# Patient Record
Sex: Female | Born: 2000 | Race: White | Hispanic: Yes | Marital: Single | Smoking: Never smoker
Health system: Southern US, Community
[De-identification: ages and names within clinical notes are randomized; demographics above are authoritative.]

## PROBLEM LIST (undated history)

## (undated) DIAGNOSIS — Z789 Other specified health status: Secondary | ICD-10-CM

## (undated) DIAGNOSIS — E282 Polycystic ovarian syndrome: Secondary | ICD-10-CM

## (undated) DIAGNOSIS — E559 Vitamin D deficiency, unspecified: Secondary | ICD-10-CM

## (undated) HISTORY — DX: Other specified health status: Z78.9

## (undated) HISTORY — DX: Polycystic ovarian syndrome: E28.2

## (undated) HISTORY — DX: Vitamin D deficiency, unspecified: E55.9

---
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
## 2020-03-17 ENCOUNTER — Encounter: Payer: Self-pay | Admitting: Pediatrics

## 2020-09-26 ENCOUNTER — Ambulatory Visit (INDEPENDENT_AMBULATORY_CARE_PROVIDER_SITE_OTHER): Payer: Medicaid Other | Admitting: Pediatrics

## 2020-09-26 ENCOUNTER — Other Ambulatory Visit: Payer: Self-pay

## 2020-09-26 ENCOUNTER — Encounter: Payer: Self-pay | Admitting: Pediatrics

## 2020-09-26 ENCOUNTER — Other Ambulatory Visit (HOSPITAL_COMMUNITY)
Admission: RE | Admit: 2020-09-26 | Discharge: 2020-09-26 | Disposition: A | Discharge status: Home / Self Care | Payer: Medicaid Other | Source: Ambulatory Visit | Attending: Pediatrics | Admitting: Pediatrics

## 2020-09-26 VITALS — BP 110/76 | Ht 63.25 in | Wt 200.4 lb

## 2020-09-26 DIAGNOSIS — E282 Polycystic ovarian syndrome: Secondary | ICD-10-CM | POA: Diagnosis not present

## 2020-09-26 DIAGNOSIS — Z114 Encounter for screening for human immunodeficiency virus [HIV]: Secondary | ICD-10-CM | POA: Diagnosis not present

## 2020-09-26 DIAGNOSIS — Z23 Encounter for immunization: Secondary | ICD-10-CM

## 2020-09-26 DIAGNOSIS — Z3202 Encounter for pregnancy test, result negative: Secondary | ICD-10-CM

## 2020-09-26 DIAGNOSIS — G4700 Insomnia, unspecified: Secondary | ICD-10-CM | POA: Diagnosis not present

## 2020-09-26 DIAGNOSIS — Z113 Encounter for screening for infections with a predominantly sexual mode of transmission: Secondary | ICD-10-CM | POA: Insufficient documentation

## 2020-09-26 DIAGNOSIS — R5383 Other fatigue: Secondary | ICD-10-CM

## 2020-09-26 DIAGNOSIS — Z68.41 Body mass index (BMI) pediatric, greater than or equal to 95th percentile for age: Secondary | ICD-10-CM | POA: Diagnosis not present

## 2020-09-26 DIAGNOSIS — Z0101 Encounter for examination of eyes and vision with abnormal findings: Secondary | ICD-10-CM | POA: Diagnosis not present

## 2020-09-26 DIAGNOSIS — L83 Acanthosis nigricans: Secondary | ICD-10-CM | POA: Diagnosis not present

## 2020-09-26 DIAGNOSIS — Z0001 Encounter for general adult medical examination with abnormal findings: Secondary | ICD-10-CM | POA: Diagnosis not present

## 2020-09-26 LAB — POCT HEMOGLOBIN: Hemoglobin: 14.8 g/dL — AB (ref 11–14.6)

## 2020-09-26 LAB — POCT RAPID HIV: Rapid HIV, POC: NEGATIVE

## 2020-09-26 LAB — POCT URINE PREGNANCY: Preg Test, Ur: NEGATIVE

## 2020-09-26 MED ORDER — NORETHINDRONE ACET-ETHINYL EST 1.5-30 MG-MCG PO TABS: 1.0000 | ORAL_TABLET | Freq: Every day | ORAL | 1 refills | Status: DC

## 2020-09-26 NOTE — Progress Notes (Signed)
Adolescent Well Care Visit Molly Walker is a 20 y.o. female who is here for well care. In 2019, dropped out of senior year, had just a few months left. Moved to [place] with a significant other and worked as a Conservation officer, nature at [employer]. Did not have any regular medical care in [place] and here for a check up.    PCP:  Molly Neat, MD   History was provided by the patient. Accompanied by older sister and nephew.   Confidentiality was discussed with the patient and, if applicable, with caregiver as well. Patient's personal or confidential phone number: [phone]  Current Issues: Current concerns include:  Fatigue   - Started about 1 month ago when she moved back to [facility] for [place], moved in with dad - Tried when she wakes up in the morning  - Trouble falling asleep , overthinks, on phone   Low Vit D - Recalls taking Vit D supplementation for 30 days in 2019, no Vit D supplements since  H/o PCOS - Menses q1-6 months, lasts 4 days, 1st day is heavy, following days lighter  - Has some facial hair and acne  - Stopped OCP in Nov 2019 - Moved to [place], not receiving medical care there   COVID Vaccine  - Dose #1 over the summer   Nutrition: Nutrition/Eating Behaviors: good variety, fruits and veg daily  Adequate calcium in diet?: cheese, yogurt Supplements/ Vitamins: no  Exercise/ Media: Play any Sports?:  none Exercise:  not active Screen Time:  > 2 hours-counseling provided Media Rules or Monitoring?: adult  Sleep:  Sleep: trouble falling asleep sometimes, not every night, uses phone at night, "overthinks"  Social Screening: Lives with:  Dad, brother 69 yo Parental relations:  good Activities, Work, and Regulatory affairs officer?: no working, helps dad around house Concerns regarding behavior with peers?  n/a Stressors of note: none  Education: School Name: dropped out  School Grade: n/a School performance: n/a School Behavior: n/a  Menstruation:   Menstrual History: 3/9  Patient has  a dental home: no  Confidential social history: Tobacco?  no Secondhand smoke exposure?  no Drugs/ETOH?  yes, Edibles  Sexually Active?  no   Pregnancy Prevention: no  Safe at home, in school & in relationships?  Yes Safe to self?  Yes   Screenings:  The patient completed the Rapid Assessment for Adolescent Preventive Services screening questionnaire and the following topics were identified as risk factors and discussed: exercise and sexuality  In addition, the following topics were discussed as part of anticipatory guidance healthy eating, exercise, tobacco use, marijuana use, drug use, condom use, birth control, sexuality, mental health issues and screen time.  PHQ-9 completed and results indicated 4  Physical Exam:  Vitals:   09/26/20 1420  BP: 110/76  Weight: 200 lb 6.4 oz (90.9 kg)  Height: 5' 3.25" (1.607 m)   BP 110/76   Ht 5' 3.25" (1.607 m)   Wt 200 lb 6.4 oz (90.9 kg)   BMI 35.22 kg/m  Body mass index: body mass index is 35.22 kg/m. Blood pressure percentiles are not available for patients who are 18 years or older.   Hearing Screening   Method: Audiometry   125Hz  250Hz  500Hz  1000Hz  2000Hz  3000Hz  4000Hz  6000Hz  8000Hz   Right ear:   20 20 20  20     Left ear:   20 20 20  20       Visual Acuity Screening   Right eye Left eye Both eyes  Without correction: 20/50 20/25  With correction:       Physical Exam General: well-appearing 20 yo F  Head: normocephalic Eyes: sclera clear, PERRL Nose: nares patent, no congestion  Mouth: moist mucous membranes, post OP clear Neck: supple, mild acanthosis nigricans  Resp: normal work, clear to auscultation BL CV: regular rate, normal S1/2, no murmur, 2+ distal pulses Ab: soft, non-distended, non-tender, + bowel sounds, no masses palpated  Skin: no rash   Neuro: awake, alert, answers questions appropriately    Assessment and Plan:    Molly Walker is a 20 yo F with the following problems here for preventative care  as a new patient  1. Encounter for general adult medical examination with abnormal findings - Hearing screening result: normal - Vision screening result: abnormal - POCT urine pregnancy negative  - Dicussed transition to adult care soon - Dicussed GED, she is thinking about applying for Fall 2022  2. BMI (body mass index), pediatric, 95-99% for age - BMI is not appropriate for age - Strong Family history of DM - Counseled on nutrition and physical activity  - POCT hemoglobin - ALT - AST - Hemoglobin A1c - Lipid panel - VITAMIN D 25 Hydroxy (Vit-D Deficiency, Fractures)  3. Routine screening for STI (sexually transmitted infection) - Recommended condom use and will prescribe OCP as below  - Urine cytology ancillary only - POCT Rapid HIV - RPR  4. PCOS (polycystic ovarian syndrome) - Menses irregular currently off OCP  - Norethindrone Acetate-Ethinyl Estradiol (LOESTRIN) 1.5-30 MG-MCG tablet; Take 1 tablet by mouth daily.  Dispense: 30 tablet; Refill: 1  5. Insomnia, unspecified type 6. Fatigue, unspecified type - Check Vit D as above, 15 in 2019 - Hgb normal - Suspect poor sleep hygeien strong player, advised sleep hygiene - Mild depression on PHQ-9 which is also likely contributor, counseling and medication declined   7. Failed vision screen - Discussed vision screen and need for Optometry evaluation   - Ambulatory referral to Optometry  8. Need for vaccination - Desired second COIVD vaccine and Flu vaccine, unable to give today, will obtain in community   Return in about 25 days (around 10/21/2020) for Lab follow-up with [facility] .  Scharlene Gloss, MD

## 2020-09-26 NOTE — Patient Instructions (Addendum)
Please find a Family Medicine or Internal Medicine provider to take over your primary medical care. We recommend [facility].    Preventive Care 20-20 Years Old, Female Preventive care refers to lifestyle choices and visits with your health care provider that can promote health and wellness. At this stage in your life, you may start seeing a primary care physician instead of a pediatrician. It is important to take responsibility for your health and well-being. Preventive care for young adults includes:  A yearly physical exam. This is also called an annual wellness visit.  Regular dental and eye exams.  Immunizations.  Screening for certain conditions.  Healthy lifestyle choices, such as: ? Eating a healthy diet. ? Getting regular exercise. ? Not using drugs or products that contain nicotine and tobacco. ? Limiting alcohol use. What can I expect for my preventive care visit? Physical exam Your health care provider may check your:  Height and weight. These may be used to calculate your BMI (body mass index). BMI is a measurement that tells if you are at a healthy weight.  Heart rate and blood pressure.  Body temperature.  Skin for abnormal spots. Counseling Your health care provider may ask you questions about your:  Past medical problems.  Family's medical history.  Alcohol, tobacco, and drug use.  Home life and relationship well-being.  Access to firearms.  Emotional well-being.  Diet, exercise, and sleep habits.  Sexual activity and sexual health.  Method of birth control.  Menstrual cycle.  Pregnancy history. What immunizations do I need? Vaccines are usually given at various ages, according to a schedule. Your health care provider will recommend vaccines for you based on your age, medical history, and lifestyle or other factors, such as travel or where you work.   What tests do I need? Blood tests  Lipid and cholesterol levels. These may be  checked every 5 years starting at age 12.  Hepatitis C test.  Hepatitis B test. Screening  Pelvic exam and Pap test. This may be done every 3 years starting at age 52.  STD (sexually transmitted disease) testing, if you are at risk.  BRCA-related cancer screening. This may be done if you have a family history of breast, ovarian, tubal, or peritoneal cancers. Other tests  Tuberculosis skin test.  Vision and hearing tests.  Skin exam.  Breast exam. Talk with your health care provider about your test results, treatment options, and if necessary, the need for more tests. Follow these instructions at home: Eating and drinking  Eat a healthy diet that includes fresh fruits and vegetables, whole grains, lean protein, and low-fat dairy products.  Drink enough fluid to keep your urine pale yellow.  Do not drink alcohol if: ? Your health care provider tells you not to drink. ? You are pregnant, may be pregnant, or are planning to become pregnant. ? You are under the legal drinking age. In the [place] the legal drinking age is 88.  If you drink alcohol: ? Limit how much you use to 0-1 drink a day. ? Be aware of how much alcohol is in your drink. In the [place] one drink equals one 12 oz bottle of beer (355 mL), one 5 oz glass of wine (148 mL), or one 1 oz glass of hard liquor (44 mL).   Lifestyle  Take daily care of your teeth and gums. Brush your teeth every morning and night with fluoride toothpaste. Floss one time each day.  Stay active. Exercise for at  least 30 minutes 5 or more days of the week.  Do not use any products that contain nicotine or tobacco, such as cigarettes, e-cigarettes, and chewing tobacco. If you need help quitting, ask your health care provider.  Do not use drugs.  If you are sexually active, practice safe sex. Use a condom or other form of protection to prevent STIs (sexually transmitted infections).  If you do not wish to become pregnant, use a form of  birth control. If you plan to become pregnant, see your health care provider for a prepregnancy visit.  Find healthy ways to cope with stress, such as: ? Meditation, yoga, or listening to music. ? Journaling. ? Talking to a trusted person. ? Spending time with friends and family. Safety  Always wear your seat belt while driving or riding in a vehicle.  Do not drive: ? If you have been drinking alcohol. Do not ride with someone who has been drinking. ? When you are tired or distracted. ? While texting.  Wear a helmet and other protective equipment during sports activities.  If you have firearms in your house, make sure you follow all gun safety procedures.  Seek help if you have been bullied, physically abused, or sexually abused.  Use the Internet responsibly to avoid dangers, such as online bullying and online sex predators. What's next?  Go to your health care provider once a year for an annual wellness visit.  Ask your health care provider how often you should have your eyes and teeth checked.  Stay up to date on all vaccines. This information is not intended to replace advice given to you by your health care provider. Make sure you discuss any questions you have with your health care provider. Document Revised: 02/28/2020 Document Reviewed: 06/26/2018 Elsevier Patient Education  2021 [employer].

## 2020-09-27 ENCOUNTER — Telehealth (INDEPENDENT_AMBULATORY_CARE_PROVIDER_SITE_OTHER): Payer: Medicaid Other | Admitting: Pediatrics

## 2020-09-27 DIAGNOSIS — E559 Vitamin D deficiency, unspecified: Secondary | ICD-10-CM

## 2020-09-27 LAB — HEMOGLOBIN A1C
Hgb A1c MFr Bld: 5.4 % of total Hgb (ref <5.7)
Mean Plasma Glucose: 108 mg/dL
eAG (mmol/L): 6 mmol/L

## 2020-09-27 LAB — LIPID PANEL
Cholesterol: 124 mg/dL (ref <170)
HDL: 46 mg/dL (ref >45)
LDL Cholesterol (Calc): 62 mg/dL (calc) (ref <110)
Non-HDL Cholesterol (Calc): 78 mg/dL (calc) (ref <120)
Total CHOL/HDL Ratio: 2.7 (calc) (ref <5.0)
Triglycerides: 82 mg/dL (ref <90)

## 2020-09-27 LAB — URINE CYTOLOGY ANCILLARY ONLY
Chlamydia: NEGATIVE
Comment: NEGATIVE
Comment: NORMAL
Neisseria Gonorrhea: NEGATIVE

## 2020-09-27 LAB — VITAMIN D 25 HYDROXY (VIT D DEFICIENCY, FRACTURES): Vit D, 25-Hydroxy: 16 ng/mL — ABNORMAL LOW (ref 30–100)

## 2020-09-27 LAB — AST: AST: 14 U/L (ref 12–32)

## 2020-09-27 LAB — RPR: RPR Ser Ql: NONREACTIVE

## 2020-09-27 LAB — ALT: ALT: 17 U/L (ref 5–32)

## 2020-09-27 MED ORDER — VITAMIN D (ERGOCALCIFEROL) 50 MCG (2000 UT) PO CAPS: 25.0000 ug | ORAL_CAPSULE | Freq: Every day | ORAL | 2 refills | Status: AC

## 2020-09-27 NOTE — Telephone Encounter (Signed)
I called Molly Walker to discuss her labs, discussed vitamin D deficiency and how that can contribute to fatigue. Recommended starting daily vitamin D, recheck in 3 months.    09/26/2020   AST 14  ALT 17  Total CHOL/HDL Ratio 2.7  Cholesterol 124  HDL Cholesterol 46  LDL Cholesterol (Calc) 62  Non-HDL Cholesterol (Calc) 78  Triglycerides 82  Vitamin D, 25-Hydroxy 16 (L)  eAG (mmol/L) 6.0  Hemoglobin A1C 5.4  RPR NON-REACTIVE   Also reminded her of goal at follow-up visit, choose an adult or family practice to continue her care.   1. Vitamin D deficiency - Vitamin D, Ergocalciferol, 50 MCG (2000 UT) CAPS; Take 25 mcg by mouth daily.  Dispense: 15 capsule; Refill: 2  Molly Gloss, MD

## 2020-10-21 ENCOUNTER — Ambulatory Visit: Payer: Medicaid Other | Admitting: Pediatrics

## 2023-05-02 ENCOUNTER — Encounter: Payer: Self-pay | Admitting: Family Medicine

## 2023-05-02 ENCOUNTER — Ambulatory Visit: Payer: Medicaid Other | Admitting: Family Medicine

## 2023-05-02 VITALS — BP 102/62 | HR 76 | Temp 98.1°F | Ht 63.0 in | Wt 202.0 lb

## 2023-05-02 DIAGNOSIS — Z8742 Personal history of other diseases of the female genital tract: Secondary | ICD-10-CM | POA: Diagnosis not present

## 2023-05-02 DIAGNOSIS — Z6835 Body mass index (BMI) 35.0-35.9, adult: Secondary | ICD-10-CM

## 2023-05-02 DIAGNOSIS — E559 Vitamin D deficiency, unspecified: Secondary | ICD-10-CM

## 2023-05-02 DIAGNOSIS — L659 Nonscarring hair loss, unspecified: Secondary | ICD-10-CM | POA: Diagnosis not present

## 2023-05-02 DIAGNOSIS — Z23 Encounter for immunization: Secondary | ICD-10-CM | POA: Diagnosis not present

## 2023-05-02 DIAGNOSIS — E66812 Obesity, class 2: Secondary | ICD-10-CM

## 2023-05-02 DIAGNOSIS — Z7689 Persons encountering health services in other specified circumstances: Secondary | ICD-10-CM

## 2023-05-02 DIAGNOSIS — N926 Irregular menstruation, unspecified: Secondary | ICD-10-CM

## 2023-05-02 DIAGNOSIS — E6609 Other obesity due to excess calories: Secondary | ICD-10-CM

## 2023-05-02 DIAGNOSIS — Z124 Encounter for screening for malignant neoplasm of cervix: Secondary | ICD-10-CM

## 2023-05-02 DIAGNOSIS — Z1159 Encounter for screening for other viral diseases: Secondary | ICD-10-CM

## 2023-05-02 NOTE — Patient Instructions (Addendum)
-  It was a pleasure to meet you and look forward to taking care of you.  -Ordered labs. Lab is not available today in office. Recommend to make an appointment for lab draw another day at this office. Please be fasting prior to appointment. -Placed a referral to GYN for cervical cancer screening and irregular menstrual cycle. Office with either call you or send you a MyChart about referral within the next 2 weeks, if not, please call office or send a MyChart message. -Follow up in 1 month for a physical exam.  -After visit, I reviewed your last pediatrician note. I added vitamin D since you have a history of deficiency and added PCOS and vitamin D deficiency to your past medical history.  -Excuse work provided by [employer], CMA

## 2023-05-02 NOTE — Progress Notes (Signed)
New Patient Office Visit  Subjective    Patient ID: Molly Walker, female    DOB: [birth year]  Age: 22 y.o. MRN: [id number]  CC:  Chief Complaint  Patient presents with   Establish Care    Irregular cycles CPE    HPI Molly Walker presents to establish care with new provider.   Patients previous primary care provider was [facility] with Dr. Scharlene Gloss. Last seen 09/26/2020.   Specialist: None   Patient is concerned about irregular menstrual cycles and hair loss. Patient reports she started her menstrual cycles at the the age of 74 and has always been irregular. She reports she goes months without a menstrual. Then, when she has a menstrual her bleeding is heavy the first two days, usually last 4-5 days. She was told she had PCOS with her pediatrician and placed on oral birth control. She has stopped taking birth control because she didn't want to take it.   She reports she has noticed hair loss for the last two months, but more this previous month. Denies having covid in the last six months.   Outpatient Encounter Medications as of 05/02/2023  Medication Sig   [DISCONTINUED] Norethindrone Acetate-Ethinyl Estradiol (LOESTRIN) 1.5-30 MG-MCG tablet Take 1 tablet by mouth daily.   No facility-administered encounter medications on file as of 05/02/2023.    Past Medical History:  Diagnosis Date   Medical history non-contributory    PCOS (polycystic ovarian syndrome)    Vitamin D deficiency     History reviewed. No pertinent surgical history.  Family History  Problem Relation Age of Onset   Diabetes Paternal Uncle        4 uncles   Diabetes Paternal Grandfather    Asthma Neg Hx    Cancer Neg Hx    Depression Neg Hx     Social History   Socioeconomic History   Marital status: Single    Spouse name: Not on file   Number of children: 0   Years of education: Not on file   Highest education level: High school  graduate  Occupational History   Not on file  Tobacco Use   Smoking status: Never   Smokeless tobacco: Never  Vaping Use   Vaping status: Never Used  Substance and Sexual Activity   Alcohol use: No   Drug use: Never   Sexual activity: Yes    Birth control/protection: Condom  Other Topics Concern   Not on file  Social History Narrative   Not on file   Social Determinants of Health   Financial Resource Strain: Low Risk  (05/02/2023)   Overall Financial Resource Strain (CARDIA)    Difficulty of Paying Living Expenses: Not very hard  Food Insecurity: No Food Insecurity (05/02/2023)   Hunger Vital Sign    Worried About Running Out of Food in the Last Year: Never true    Ran Out of Food in the Last Year: Never true  Transportation Needs: No Transportation Needs (05/02/2023)   PRAPARE - Administrator, Civil Service (Medical): No    Lack of Transportation (Non-Medical): No  Physical Activity: Sufficiently Active (05/02/2023)   Exercise Vital Sign    Days of Exercise per Week: 4 days    Minutes of Exercise per Session: 90 min  Stress: Stress Concern Present (05/02/2023)   [employer] of Occupational Health - Occupational Stress Questionnaire    Feeling of Stress : To some extent  Social Connections: Moderately Isolated (05/02/2023)   Social Connection and Isolation Panel [NHANES]    Frequency of Communication with Friends and Family: More than three times a week    Frequency of Social Gatherings with Friends and Family: Twice a week    Attends Religious Services: 1 to 4 times per year    Active Member of [employer] or Organizations: No    Attends Banker Meetings: Never    Marital Status: Never married  Intimate Partner Violence: Not At Risk (05/02/2023)   Humiliation, Afraid, Rape, and Kick questionnaire    Fear of Current or Ex-Partner: No    Emotionally Abused: No    Physically Abused: No    Sexually Abused: No    ROS See HPI above     Objective   BP 102/62 (BP Location: Left Arm, Patient Position: Sitting, Cuff Size: Large)   Pulse 76   Temp 98.1 F (36.7 C) (Oral)   Ht 5\' 3"  (1.6 m)   Wt 202 lb (91.6 kg)   LMP 03/31/2023 (Approximate)   SpO2 98%   BMI 35.78 kg/m   Physical Exam Vitals reviewed.  Constitutional:      General: She is not in acute distress.    Appearance: Normal appearance. She is obese. She is not ill-appearing, toxic-appearing or diaphoretic.  HENT:     Head: Normocephalic and atraumatic.  Eyes:     General:        Right eye: No discharge.        Left eye: No discharge.     Conjunctiva/sclera: Conjunctivae normal.  Cardiovascular:     Rate and Rhythm: Normal rate and regular rhythm.     Heart sounds: Normal heart sounds. No murmur heard.    No friction rub. No gallop.  Pulmonary:     Effort: Pulmonary effort is normal. No respiratory distress.     Breath sounds: Normal breath sounds.  Musculoskeletal:        General: Normal range of motion.  Skin:    General: Skin is warm and dry.  Neurological:     General: No focal deficit present.     Mental Status: She is alert and oriented to person, place, and time. Mental status is at baseline.  Psychiatric:        Mood and Affect: Mood normal.        Behavior: Behavior normal.        Thought Content: Thought content normal.        Judgment: Judgment normal.      Assessment & Plan:  Class 2 obesity due to excess calories without serious comorbidity with body mass index (BMI) of 35.0 to 35.9 in adult -     TSH -     CBC with Differential/Platelet; Future -     Comprehensive metabolic panel; Future -     Hemoglobin A1c; Future -     Lipid panel; Future  Menstrual irregularity -     Ambulatory referral to Gynecology  Need for influenza vaccination -     Flu vaccine trivalent PF, 6mos and older(Flulaval,Afluria,Fluarix,Fluzone)  Vitamin D deficiency -     VITAMIN D 25 Hydroxy (Vit-D Deficiency, Fractures); Future  Hair loss -      TSH -     ANA; Future -     C-reactive protein; Future -     CBC with Differential/Platelet; Future -     Comprehensive metabolic panel; Future -     Hemoglobin A1c; Future  History of PCOS -     Ambulatory referral to Gynecology  Need for hepatitis C screening test -     Hepatitis C antibody; Future  Cervical cancer screening -     Ambulatory referral to Gynecology  Encounter to establish care   1.Review health maintenance:  -Cervical Caner Screening: Referral placed to GYN.  -Covid booster: Declines  -Influenza vaccine: Ordered and administered -Tdap vacccine: Due, recommend to obtain at local pharmacy or health department -Hep C screening: Ordered screening 2.Placed a referral to GYN for cervical cancer screening, irregular menstrual cycles, and history of PCOS.  3.Ordered labs based on complaints of hair, obesity, and past history of vitamin deficiency. Lab is not available in office today. Recommend to make an appointment for lab visit and advised to be fasting.  4.Taylasia, CMA provided an excuse work note.   Return in about 1 month (around 06/02/2023) for physical; Lab appointment-fasting .   Zandra Abts, NP

## 2023-05-17 ENCOUNTER — Other Ambulatory Visit (INDEPENDENT_AMBULATORY_CARE_PROVIDER_SITE_OTHER): Payer: Medicaid Other

## 2023-05-17 DIAGNOSIS — Z6835 Body mass index (BMI) 35.0-35.9, adult: Secondary | ICD-10-CM | POA: Diagnosis not present

## 2023-05-17 DIAGNOSIS — E6609 Other obesity due to excess calories: Secondary | ICD-10-CM | POA: Diagnosis not present

## 2023-05-17 DIAGNOSIS — L659 Nonscarring hair loss, unspecified: Secondary | ICD-10-CM | POA: Diagnosis not present

## 2023-05-17 DIAGNOSIS — E66812 Obesity, class 2: Secondary | ICD-10-CM

## 2023-05-17 DIAGNOSIS — Z1159 Encounter for screening for other viral diseases: Secondary | ICD-10-CM

## 2023-05-17 DIAGNOSIS — E559 Vitamin D deficiency, unspecified: Secondary | ICD-10-CM

## 2023-05-17 LAB — CBC WITH DIFFERENTIAL/PLATELET
Basophils Absolute: 0.1 10*3/uL (ref 0.0–0.1)
Basophils Relative: 0.6 % (ref 0.0–3.0)
Eosinophils Absolute: 0.1 10*3/uL (ref 0.0–0.7)
Eosinophils Relative: 1.3 % (ref 0.0–5.0)
HCT: 40.2 % (ref 36.0–46.0)
Hemoglobin: 12.8 g/dL (ref 12.0–15.0)
Lymphocytes Relative: 30.1 % (ref 12.0–46.0)
Lymphs Abs: 2.9 10*3/uL (ref 0.7–4.0)
MCHC: 31.7 g/dL (ref 30.0–36.0)
MCV: 88 fL (ref 78.0–100.0)
Monocytes Absolute: 0.5 10*3/uL (ref 0.1–1.0)
Monocytes Relative: 5.7 % (ref 3.0–12.0)
Neutro Abs: 6 10*3/uL (ref 1.4–7.7)
Neutrophils Relative %: 62.3 % (ref 43.0–77.0)
Platelets: 381 10*3/uL (ref 150.0–400.0)
RBC: 4.57 Mil/uL (ref 3.87–5.11)
RDW: 13.1 % (ref 11.5–15.5)
WBC: 9.6 10*3/uL (ref 4.0–10.5)

## 2023-05-17 LAB — COMPREHENSIVE METABOLIC PANEL
ALT: 34 U/L (ref 0–35)
AST: 20 U/L (ref 0–37)
Albumin: 3.9 g/dL (ref 3.5–5.2)
Alkaline Phosphatase: 97 U/L (ref 39–117)
BUN: 12 mg/dL (ref 6–23)
CO2: 27 meq/L (ref 19–32)
Calcium: 8.8 mg/dL (ref 8.4–10.5)
Chloride: 106 meq/L (ref 96–112)
Creatinine, Ser: 0.71 mg/dL (ref 0.40–1.20)
GFR: 120.7 mL/min (ref >60.00)
Glucose, Bld: 89 mg/dL (ref 70–99)
Potassium: 3.4 meq/L — ABNORMAL LOW (ref 3.5–5.1)
Sodium: 138 meq/L (ref 135–145)
Total Bilirubin: 0.4 mg/dL (ref 0.2–1.2)
Total Protein: 6.7 g/dL (ref 6.0–8.3)

## 2023-05-17 LAB — LIPID PANEL
Cholesterol: 135 mg/dL (ref 0–200)
HDL: 38.6 mg/dL — ABNORMAL LOW (ref >39.00)
LDL Cholesterol: 71 mg/dL (ref 0–99)
NonHDL: 96.4
Total CHOL/HDL Ratio: 3
Triglycerides: 128 mg/dL (ref 0.0–149.0)
VLDL: 25.6 mg/dL (ref 0.0–40.0)

## 2023-05-17 LAB — HEMOGLOBIN A1C: Hgb A1c MFr Bld: 5.4 % (ref 4.6–6.5)

## 2023-05-17 LAB — VITAMIN D 25 HYDROXY (VIT D DEFICIENCY, FRACTURES): VITD: 13.56 ng/mL — ABNORMAL LOW (ref 30.00–100.00)

## 2023-05-17 LAB — C-REACTIVE PROTEIN: CRP: <1 mg/dL (ref 0.5–20.0)

## 2023-05-19 LAB — ANTI-NUCLEAR AB-TITER (ANA TITER): ANA Titer 1: 1:80 {titer} — ABNORMAL HIGH

## 2023-05-19 LAB — HEPATITIS C ANTIBODY: Hepatitis C Ab: NONREACTIVE

## 2023-05-19 LAB — ANA: Anti Nuclear Antibody (ANA): POSITIVE — AB

## 2023-05-20 ENCOUNTER — Other Ambulatory Visit: Payer: Self-pay

## 2023-05-20 DIAGNOSIS — R768 Other specified abnormal immunological findings in serum: Secondary | ICD-10-CM

## 2023-06-03 ENCOUNTER — Encounter: Payer: Medicaid Other | Admitting: Family Medicine

## 2023-06-03 NOTE — Progress Notes (Deleted)
   Complete physical exam  Patient: Molly Walker   DOB: 12-21-2000   22 y.o. Female  MRN: [id number]  Subjective:    No chief complaint on file.   Molly Walker is a 22 y.o. female who presents today for a complete physical exam. She reports consuming a {diet types:17450} diet. {types:19826} She generally feels {DESC; WELL/FAIRLY WELL/POORLY:18703}. She reports sleeping {DESC; WELL/FAIRLY WELL/POORLY:18703}. She {does/does not:200015} have additional problems to discuss today.    Most recent fall risk assessment:    05/02/2023    7:49 AM  Fall Risk   Falls in the past year? 0  Number falls in past yr: 0  Injury with Fall? 0  Risk for fall due to : No Fall Risks  Follow up Falls evaluation completed     Most recent depression screenings:    05/02/2023    7:49 AM 09/26/2020    3:48 PM  PHQ 2/9 Scores  PHQ - 2 Score 0 0  PHQ- 9 Score  4    {VISON DENTAL STD PSA (Optional):27386}  {History (Optional):23778}  Patient Care Team: Alveria Apley, NP as PCP - General (Family Medicine)   No outpatient medications prior to visit.   No facility-administered medications prior to visit.    ROS See HPI above       Objective:     There were no vitals taken for this visit. {Vitals History (Optional):23777}  Physical Exam   No results found for any visits on 06/03/23. {Show previous labs (optional):23779}    Assessment & Plan:    Routine Health Maintenance and Physical Exam  Immunization History  Administered Date(s) Administered   DTaP 02/26/2001, 04/23/2001, 06/27/2001, 04/28/2002, 12/19/2004   HIB (PRP-OMP) 02/26/2001, 04/23/2001, 06/27/2001, 04/28/2002   HPV Quadrivalent 07/22/2012, 10/20/2012, 04/30/2013   Hepatitis A 03/31/2010, 04/25/2011   Hepatitis B 2000[birth year]31, 02/26/2001, 10/01/2001   IPV 02/26/2001, 04/23/2001, 12/19/2001, 12/19/2004   Influenza, Seasonal, Injecte, Preservative Fre 05/02/2023   Influenza,Quad,Nasal, Live 04/21/2014    Influenza,inj,Quad PF,6+ Mos 05/04/2015   Influenza,inj,quad, With Preservative 04/30/2013   Influenza-Unspecified 06/23/2002, 07/27/2002, 06/23/2003, 08/07/2010, 04/25/2011, 07/05/2012   MMR 12/19/2001, 12/19/2004   Meningococcal Conjugate 07/22/2012   Pneumococcal-Unspecified 02/26/2001, 04/23/2001, 06/27/2001, 02/26/2002, 04/28/2002   Td 07/22/2012   Tdap 07/22/2012   Varicella 04/23/2002, 03/31/2010    Health Maintenance  Topic Date Due   Cervical Cancer Screening (Pap smear)  Never done   DTaP/Tdap/Td (8 - Td or Tdap) 07/22/2022   COVID-19 Vaccine (1 - 2023-24 season) Never done   INFLUENZA VACCINE  Completed   HPV VACCINES  Completed   Hepatitis C Screening  Completed   HIV Screening  Completed    Discussed health benefits of physical activity, and encouraged her to engage in regular exercise appropriate for her age and condition.  There are no diagnoses linked to this encounter. 1.Review health maintenance:  -Cervical cancer screening: Has an appointment with Wyline Beady, NP [facility] booster: Declines -Tdap vaccine: Due, recommend to obtain at local pharmacy or local health department.  No follow-ups on file.   Needs 3 month vitamin D lab visit     Zandra Abts, NP

## 2023-07-01 ENCOUNTER — Ambulatory Visit: Payer: Medicaid Other | Admitting: Nurse Practitioner

## 2023-07-01 ENCOUNTER — Encounter: Payer: Self-pay | Admitting: Nurse Practitioner

## 2023-07-01 VITALS — BP 112/80 | HR 98 | Ht 63.0 in | Wt 207.0 lb

## 2023-07-01 DIAGNOSIS — E282 Polycystic ovarian syndrome: Secondary | ICD-10-CM | POA: Diagnosis not present

## 2023-07-01 DIAGNOSIS — N926 Irregular menstruation, unspecified: Secondary | ICD-10-CM | POA: Diagnosis not present

## 2023-07-01 DIAGNOSIS — Z8349 Family history of other endocrine, nutritional and metabolic diseases: Secondary | ICD-10-CM

## 2023-07-01 MED ORDER — DROSPIRENONE-ETHINYL ESTRADIOL 3-0.02 MG PO TABS: 1.0000 | ORAL_TABLET | Freq: Every day | ORAL | 1 refills | Status: DC

## 2023-07-01 NOTE — Progress Notes (Signed)
   Acute Office Visit  Subjective:    Patient ID: Molly Walker, female    DOB: Apr 23, 2001, 22 y.o.   MRN: [id number]   HPI 22 y.o. G0 presents as new patient for irregular periods. Cycles have been irregular since menarche at age 34. Goes months without cycle at times. Previously diagnosed with PCOS by pediatrician, was on OCPs for a while. Stopped because she was still skipping periods. Being evaluated for hair loss - 05/17/23 low Vit D, + ANA, normal A1c. TSH not obtained with plans to get at 3 month visit. Sister and MGM with thyroid disease. Not currently sexually active.   Patient's last menstrual period was 05/20/2023 (approximate).    Review of Systems  Constitutional: Negative.   Endocrine: Negative for cold intolerance and heat intolerance.  Genitourinary:  Positive for menstrual problem.       Objective:    Physical Exam Constitutional:      Appearance: Normal appearance. She is obese.   GU: Not indicated  BP 112/80   Pulse 98   Ht 5\' 3"  (1.6 m)   Wt 207 lb (93.9 kg)   LMP 05/20/2023 (Approximate)   SpO2 98%   BMI 36.67 kg/m  Wt Readings from Last 3 Encounters:  07/01/23 207 lb (93.9 kg)  05/02/23 202 lb (91.6 kg)  09/26/20 200 lb 6.4 oz (90.9 kg) (97%, Z= 1.95)*   * Growth percentiles are based on CDC (Girls, 2-20 Years) data.         Assessment & Plan:   Problem List Items Addressed This Visit       Endocrine   PCOS (polycystic ovarian syndrome) - Primary   Relevant Medications   drospirenone-ethinyl estradiol (YAZ) 3-0.02 MG tablet     Other   Menstrual irregularity   Relevant Orders   Thyroid Panel With TSH   Other Visit Diagnoses       Family history of thyroid disease       Relevant Orders   Thyroid Panel With TSH      Plan: Discussed PCOS symptoms, diagnosis and management options with supplements, birth control and/or Metformin. Would like to restart birth control pills but a different one. OTC Inositol. Thyroid panel today.    Return in about 3 months (around 09/29/2023) for Annual + med follow up.    [name] DNP, 10:53 AM 07/01/2023

## 2023-07-02 LAB — THYROID PANEL WITH TSH
Free Thyroxine Index: 2.1 (ref 1.4–3.8)
T3 Uptake: 28 % (ref 22–35)
T4, Total: 7.6 ug/dL (ref 5.1–11.9)
TSH: 1.2 m[IU]/L
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
## 2023-07-24 ENCOUNTER — Encounter: Payer: Medicaid Other | Admitting: Rheumatology

## 2023-07-24 DIAGNOSIS — N926 Irregular menstruation, unspecified: Secondary | ICD-10-CM

## 2023-07-24 DIAGNOSIS — L700 Acne vulgaris: Secondary | ICD-10-CM

## 2023-07-24 DIAGNOSIS — E282 Polycystic ovarian syndrome: Secondary | ICD-10-CM

## 2023-07-24 DIAGNOSIS — R768 Other specified abnormal immunological findings in serum: Secondary | ICD-10-CM

## 2023-09-04 ENCOUNTER — Encounter: Payer: Medicaid Other | Admitting: Family Medicine

## 2023-09-06 ENCOUNTER — Ambulatory Visit: Payer: Medicaid Other | Admitting: Family Medicine

## 2023-09-13 ENCOUNTER — Telehealth: Payer: Self-pay

## 2023-09-13 ENCOUNTER — Ambulatory Visit (INDEPENDENT_AMBULATORY_CARE_PROVIDER_SITE_OTHER): Payer: Medicaid Other | Admitting: Family Medicine

## 2023-09-13 ENCOUNTER — Encounter: Payer: Self-pay | Admitting: Family Medicine

## 2023-09-13 VITALS — BP 120/90 | HR 76 | Temp 98.2°F | Ht 63.0 in | Wt 204.2 lb

## 2023-09-13 DIAGNOSIS — Z0000 Encounter for general adult medical examination without abnormal findings: Secondary | ICD-10-CM

## 2023-09-13 DIAGNOSIS — R5383 Other fatigue: Secondary | ICD-10-CM | POA: Diagnosis not present

## 2023-09-13 DIAGNOSIS — E559 Vitamin D deficiency, unspecified: Secondary | ICD-10-CM | POA: Diagnosis not present

## 2023-09-13 LAB — CBC WITH DIFFERENTIAL/PLATELET
Basophils Absolute: 0 10*3/uL (ref 0.0–0.1)
Basophils Relative: 0.4 % (ref 0.0–3.0)
Eosinophils Absolute: 0.1 10*3/uL (ref 0.0–0.7)
Eosinophils Relative: 1.4 % (ref 0.0–5.0)
HCT: 42.8 % (ref 36.0–46.0)
Hemoglobin: 14.1 g/dL (ref 12.0–15.0)
Lymphocytes Relative: 15.2 % (ref 12.0–46.0)
Lymphs Abs: 1.3 10*3/uL (ref 0.7–4.0)
MCHC: 32.9 g/dL (ref 30.0–36.0)
MCV: 87.4 fL (ref 78.0–100.0)
Monocytes Absolute: 0.4 10*3/uL (ref 0.1–1.0)
Monocytes Relative: 5.3 % (ref 3.0–12.0)
Neutro Abs: 6.4 10*3/uL (ref 1.4–7.7)
Neutrophils Relative %: 77.7 % — ABNORMAL HIGH (ref 43.0–77.0)
Platelets: 339 10*3/uL (ref 150.0–400.0)
RBC: 4.9 Mil/uL (ref 3.87–5.11)
RDW: 13.7 % (ref 11.5–15.5)
WBC: 8.3 10*3/uL (ref 4.0–10.5)

## 2023-09-13 LAB — COMPREHENSIVE METABOLIC PANEL
ALT: 18 U/L (ref 0–35)
AST: 15 U/L (ref 0–37)
Albumin: 4.1 g/dL (ref 3.5–5.2)
Alkaline Phosphatase: 106 U/L (ref 39–117)
BUN: 8 mg/dL (ref 6–23)
CO2: 28 meq/L (ref 19–32)
Calcium: 9.2 mg/dL (ref 8.4–10.5)
Chloride: 105 meq/L (ref 96–112)
Creatinine, Ser: 0.69 mg/dL (ref 0.40–1.20)
GFR: 122.92 mL/min (ref >60.00)
Glucose, Bld: 85 mg/dL (ref 70–99)
Potassium: 4.4 meq/L (ref 3.5–5.1)
Sodium: 140 meq/L (ref 135–145)
Total Bilirubin: 0.4 mg/dL (ref 0.2–1.2)
Total Protein: 6.7 g/dL (ref 6.0–8.3)

## 2023-09-13 LAB — VITAMIN D 25 HYDROXY (VIT D DEFICIENCY, FRACTURES): VITD: 15.06 ng/mL — ABNORMAL LOW (ref 30.00–100.00)

## 2023-09-13 LAB — TSH: TSH: 0.94 u[IU]/mL (ref 0.35–5.50)

## 2023-09-13 LAB — VITAMIN B12: Vitamin B-12: 251 pg/mL (ref 211–911)

## 2023-09-13 NOTE — Progress Notes (Signed)
 Complete physical exam  Patient: Molly Walker   DOB: 2000-10-16   22 y.o. Female  MRN: [id number]  Subjective:    Chief Complaint  Patient presents with   Annual Exam    Molly Walker is a 23 y.o. female who presents today for a complete physical exam. She reports consuming a general diet. Gym/ health club routine includes cardio and light weights. Patient is doing this 5 days a week, 1 hour. She generally feels well. She reports sleeping poorly. She does not have additional problems to discuss today.    Most recent fall risk assessment:    05/02/2023    7:49 AM  Fall Risk   Falls in the past year? 0  Number falls in past yr: 0  Injury with Fall? 0  Risk for fall due to : No Fall Risks  Follow up Falls evaluation completed     Most recent depression screenings:    07/01/2023   10:28 AM 05/02/2023    7:49 AM  PHQ 2/9 Scores  PHQ - 2 Score 0 0    Vision:Not within last year  and Dental: No current dental problems and No regular dental care   Past Medical History:  Diagnosis Date   Medical history non-contributory    PCOS (polycystic ovarian syndrome)    Vitamin D deficiency    No past surgical history on file. Social History   Tobacco Use   Smoking status: Never   Smokeless tobacco: Never  Vaping Use   Vaping status: Never Used  Substance Use Topics   Alcohol use: Yes    Comment: Once every 2 weeks   Drug use: Never   Social History   Socioeconomic History   Marital status: Single    Spouse name: Not on file   Number of children: 0   Years of education: Not on file   Highest education level: High school graduate  Occupational History   Not on file  Tobacco Use   Smoking status: Never   Smokeless tobacco: Never  Vaping Use   Vaping status: Never Used  Substance and Sexual Activity   Alcohol use: Yes    Comment: Once every 2 weeks   Drug use: Never   Sexual activity: Not Currently    Partners: Male    Birth control/protection: Condom     Comment: Menarche @ 23 y/o, First IC @ 75, No hx of STIs  Other Topics Concern   Not on file  Social History Narrative   Not on file   Social Drivers of Health   Financial Resource Strain: Low Risk  (05/02/2023)   Overall Financial Resource Strain (CARDIA)    Difficulty of Paying Living Expenses: Not very hard  Food Insecurity: No Food Insecurity (05/02/2023)   Hunger Vital Sign    Worried About Running Out of Food in the Last Year: Never true    Ran Out of Food in the Last Year: Never true  Transportation Needs: No Transportation Needs (05/02/2023)   PRAPARE - Administrator, Civil Service (Medical): No    Lack of Transportation (Non-Medical): No  Physical Activity: Sufficiently Active (05/02/2023)   Exercise Vital Sign    Days of Exercise per Week: 4 days    Minutes of Exercise per Session: 90 min  Stress: Stress Concern Present (05/02/2023)   [employer] of Occupational Health - Occupational Stress Questionnaire    Feeling of Stress : To some extent  Social Connections: Moderately Isolated (05/02/2023)  Social Advertising account executive [NHANES]    Frequency of Communication with Friends and Family: More than three times a week    Frequency of Social Gatherings with Friends and Family: Twice a week    Attends Religious Services: 1 to 4 times per year    Active Member of [employer] or Organizations: No    Attends Banker Meetings: Never    Marital Status: Never married  Intimate Partner Violence: Not At Risk (05/02/2023)   Humiliation, Afraid, Rape, and Kick questionnaire    Fear of Current or Ex-Partner: No    Emotionally Abused: No    Physically Abused: No    Sexually Abused: No   Family Status  Relation Name Status   Mother  Alive   Father  Alive   Sister  Alive   Sister  Alive   Brother  Alive   MGM  Deceased   MGF  Alive   PGM  Deceased   PGF  Deceased   Nutritional therapist  (Not Specified)   Neg Hx  (Not Specified)  No partnership data  on file   Family History  Problem Relation Age of Onset   Thyroid disease Sister    Thyroid disease Maternal Grandmother    Hypertension Paternal Grandmother    Hypertension Paternal Grandfather    Diabetes Paternal Grandfather    Diabetes Paternal Uncle        4 uncles   Asthma Neg Hx    Cancer Neg Hx    Depression Neg Hx    No Known Allergies   Patient Care Team: Alveria Apley, NP as PCP - General (Family Medicine)  Wyline Beady, NP as GYN [facility] [place])   Outpatient Medications Prior to Visit  Medication Sig   drospirenone-ethinyl estradiol (YAZ) 3-0.02 MG tablet Take 1 tablet by mouth daily.   No facility-administered medications prior to visit.   Review of Systems  Constitutional:  Positive for malaise/fatigue.  HENT: Negative.    Eyes: Negative.   Respiratory: Negative.    Cardiovascular: Negative.   Gastrointestinal: Negative.   Genitourinary: Negative.   Musculoskeletal: Negative.   Skin: Negative.   Neurological:  Positive for dizziness (With headaches), tingling (Hands-Intermittent) and headaches.  Endo/Heme/Allergies: Negative.   Psychiatric/Behavioral:  Positive for depression. The patient is nervous/anxious.   See HPI above    Objective:   BP (!) 120/90 (BP Location: Left Arm, Patient Position: Sitting, Cuff Size: Normal)   Pulse 76   Temp 98.2 F (36.8 C) (Oral)   Ht 5\' 3"  (1.6 m)   Wt 204 lb 3.2 oz (92.6 kg)   LMP 08/19/2023 (Exact Date)   SpO2 98%   BMI 36.17 kg/m    Physical Exam Vitals reviewed.  Constitutional:      General: She is not in acute distress.    Appearance: Normal appearance. She is obese. She is not ill-appearing or toxic-appearing.  HENT:     Head: Normocephalic and atraumatic.     Right Ear: Ear canal and external ear normal. There is no impacted cerumen. Tympanic membrane is scarred.     Left Ear: Ear canal and external ear normal. There is no impacted cerumen. Tympanic membrane is scarred.      Mouth/Throat:     Mouth: Mucous membranes are moist.     Pharynx: Oropharynx is clear. Uvula midline. No pharyngeal swelling, oropharyngeal exudate, posterior oropharyngeal erythema or uvula swelling.  Eyes:     General:  Right eye: No discharge.        Left eye: No discharge.     Conjunctiva/sclera: Conjunctivae normal.     Pupils: Pupils are equal, round, and reactive to light.  Neck:     Thyroid: No thyromegaly.  Cardiovascular:     Rate and Rhythm: Normal rate and regular rhythm.     Pulses:          Dorsalis pedis pulses are 2+ on the right side and 2+ on the left side.     Heart sounds: Normal heart sounds. No murmur heard.    No friction rub. No gallop.  Pulmonary:     Effort: Pulmonary effort is normal. No respiratory distress.     Breath sounds: Normal breath sounds.  Abdominal:     General: Abdomen is flat. Bowel sounds are normal. There is no distension.     Palpations: Abdomen is soft.     Tenderness: There is no abdominal tenderness. There is no right CVA tenderness or left CVA tenderness.  Musculoskeletal:        General: Normal range of motion.     Cervical back: Normal range of motion and neck supple.     Right lower leg: No edema.     Left lower leg: No edema.  Skin:    General: Skin is warm and dry.  Neurological:     General: No focal deficit present.     Mental Status: She is alert and oriented to person, place, and time. Mental status is at baseline.  Psychiatric:        Mood and Affect: Mood normal.        Behavior: Behavior normal.        Thought Content: Thought content normal.        Judgment: Judgment normal.       Assessment & Plan:    Routine Health Maintenance and Physical Exam  Immunization History  Administered Date(s) Administered   DTaP 02/26/2001, 04/23/2001, 06/27/2001, 04/28/2002, 12/19/2004   HIB (PRP-OMP) 02/26/2001, 04/23/2001, 06/27/2001, 04/28/2002   HPV Quadrivalent 07/22/2012, 10/20/2012, 04/30/2013   Hepatitis A  03/31/2010, 04/25/2011   Hepatitis B 12-03-2000, 02/26/2001, 10/01/2001   IPV 02/26/2001, 04/23/2001, 12/19/2001, 12/19/2004   Influenza, Seasonal, Injecte, Preservative Fre 05/02/2023   Influenza,Quad,Nasal, Live 04/21/2014   Influenza,inj,Quad PF,6+ Mos 05/04/2015   Influenza,inj,quad, With Preservative 04/30/2013   Influenza-Unspecified 06/23/2002, 07/27/2002, 06/23/2003, 08/07/2010, 04/25/2011, 07/05/2012   MMR 12/19/2001, 12/19/2004   Meningococcal Conjugate 07/22/2012   Pneumococcal-Unspecified 02/26/2001, 04/23/2001, 06/27/2001, 02/26/2002, 04/28/2002   Td 07/22/2012   Tdap 07/22/2012   Varicella 04/23/2002, 03/31/2010    Health Maintenance  Topic Date Due   COVID-19 Vaccine (1 - 2024-25 season) Never done   Cervical Cancer Screening (Pap smear)  03/12/2024 (Originally 12/19/2021)   DTaP/Tdap/Td (8 - Td or Tdap) 05/12/2024 (Originally 07/22/2022)   INFLUENZA VACCINE  Completed   HPV VACCINES  Completed   Hepatitis C Screening  Completed   HIV Screening  Completed    Discussed health benefits of physical activity, and encouraged her to engage in regular exercise appropriate for her age and condition.  Annual physical exam  Vitamin D deficiency -     VITAMIN D 25 Hydroxy (Vit-D Deficiency, Fractures)  Fatigue, unspecified type -     TSH -     Comprehensive metabolic panel -     VITAMIN D 25 Hydroxy (Vit-D Deficiency, Fractures) -     CBC with Differential/Platelet -     Iron, TIBC and Ferritin  Panel -     Vitamin B12   1.Review health maintenance  -Covid booster: Declines -Cervical Cancer: Appointment scheduled later this month with Earlene Plater, NP -Tdap: Local pharmacy-recommend to obtain 2. Physical exam completed today. No concerns. 3.Ordered labs (TSH, CBC, Iron Panel, Vitamin D/B12, and CMP) for reason of fatigue and vitamin D deficiency. Office will call with lab results. 4. Please follow up with an appointment to rheumatology for a positive ANA in November 2024.   [facility] [address] [place], [place] [address] [phone] -Follow up in 1 year for a physical.  Return in about 1 year (around 09/12/2024) for physical.   Zandra Abts, NP

## 2023-09-13 NOTE — Telephone Encounter (Signed)
 Patient contacted the office to schedule an appointment. After reviewing the chart, the patient has no showed the first appointment with Dr. Corliss Skains. There is nothing indicating whether the patient can or cannot be scheduled again or the severity of her needing to see a Rheumatologist that I could find. Please advise patient on what she should do.

## 2023-09-13 NOTE — Patient Instructions (Addendum)
-  It was great to see you today. Continue participating in regular exercise. That is great news about you starting to work out at the gym. -Physical exam completed today. No concerns. -Ordered labs. Office will call with lab results. -You may get your Tdap at your local pharmacy.  -Please follow up with an appointment to rheumatology for a positive ANA in November 2024.  [facility] [address] [place], [place] [address] [phone] -Follow up in 1 year for a physical.

## 2023-09-14 LAB — IRON,TIBC AND FERRITIN PANEL
%SAT: 16 % (ref 16–45)
Ferritin: 16 ng/mL (ref 16–154)
Iron: 58 ug/dL (ref 40–190)
TIBC: 371 ug/dL (ref 250–450)

## 2023-09-16 ENCOUNTER — Encounter: Payer: Self-pay | Admitting: Family Medicine

## 2023-09-16 DIAGNOSIS — R768 Other specified abnormal immunological findings in serum: Secondary | ICD-10-CM

## 2023-10-02 NOTE — Telephone Encounter (Signed)
 Let pt know about rheumatology and she is understanding and aware

## 2023-10-10 ENCOUNTER — Ambulatory Visit (INDEPENDENT_AMBULATORY_CARE_PROVIDER_SITE_OTHER): Payer: Medicaid Other | Admitting: Nurse Practitioner

## 2023-10-10 ENCOUNTER — Telehealth: Payer: Self-pay | Admitting: Family Medicine

## 2023-10-10 ENCOUNTER — Encounter: Payer: Self-pay | Admitting: Nurse Practitioner

## 2023-10-10 ENCOUNTER — Other Ambulatory Visit (HOSPITAL_COMMUNITY)
Admission: RE | Admit: 2023-10-10 | Discharge: 2023-10-10 | Disposition: A | Discharge status: Home / Self Care | Source: Ambulatory Visit | Attending: Nurse Practitioner | Admitting: Nurse Practitioner

## 2023-10-10 VITALS — BP 122/84 | HR 74 | Ht 63.5 in | Wt 201.0 lb

## 2023-10-10 DIAGNOSIS — Z1331 Encounter for screening for depression: Secondary | ICD-10-CM

## 2023-10-10 DIAGNOSIS — Z01419 Encounter for gynecological examination (general) (routine) without abnormal findings: Secondary | ICD-10-CM | POA: Diagnosis not present

## 2023-10-10 DIAGNOSIS — Z124 Encounter for screening for malignant neoplasm of cervix: Secondary | ICD-10-CM | POA: Diagnosis not present

## 2023-10-10 DIAGNOSIS — E282 Polycystic ovarian syndrome: Secondary | ICD-10-CM | POA: Diagnosis not present

## 2023-10-10 MED ORDER — DROSPIRENONE-ETHINYL ESTRADIOL 3-0.02 MG PO TABS: 1.0000 | ORAL_TABLET | Freq: Every day | ORAL | 3 refills | Status: DC

## 2023-10-10 NOTE — Telephone Encounter (Signed)
 Copied from CRM [id number]. Topic: Referral - Status >> Oct 10, 2023  3:08 PM Denese Killings wrote: Reason for CRM: Ladona Ridgel with Rheumatology received referral for positive A & A but did not recieve the results. Fax [phone]

## 2023-10-10 NOTE — Progress Notes (Signed)
 Molly Walker [birth year] [id number]   History:  23 y.o. G0 presents for annual exam. No GYN complaints. PCOS - restarted COCs in December with good management. Vit D deficiency, taking high dose supplement. Completed Gardasil series.   Gynecologic History Patient's last menstrual period was 10/01/2023 (exact date). Period Duration (Days): 4-5 Period Pattern: (!) Irregular Menstrual Flow: Moderate Menstrual Control: Maxi pad Dysmenorrhea: (!) Mild Contraception/Family planning: abstinence and OCP (estrogen/progesterone) Sexually active: No  Health Maintenance Last Pap: Never Last mammogram: Not indicated Last colonoscopy: Not indicated Last Dexa: Not indicated      10/10/2023   11:28 AM  Depression screen PHQ 2/9  Decreased Interest 1  Down, Depressed, Hopeless 1  PHQ - 2 Score 2  Altered sleeping 3  Tired, decreased energy 3  Change in appetite 3  Feeling bad or failure about yourself  1  Trouble concentrating 3  Moving slowly or fidgety/restless 0  Suicidal thoughts 0  PHQ-9 Score 15     Past medical history, past surgical history, family history and social history were all reviewed and documented in the EPIC chart. Works nights for company making prescription glasses.   ROS:  A ROS was performed and pertinent positives and negatives are included.  Exam:  Vitals:   10/10/23 1124  BP: 122/84  Pulse: 74  SpO2: 98%  Weight: 201 lb (91.2 kg)  Height: 5' 3.5" (1.613 m)   Body mass index is 35.05 kg/m.   General appearance:  Normal Thyroid:  Symmetrical, normal in size, without palpable masses or nodularity. Respiratory  Auscultation:  Clear without wheezing or rhonchi Cardiovascular  Auscultation:  Regular rate, without rubs, murmurs or gallops  Edema/varicosities:  Not grossly evident Abdominal  Soft,nontender, without masses, guarding or rebound.  Liver/spleen:  No organomegaly noted  Hernia:  None appreciated  Skin  Inspection:  Grossly  normal Breasts: Not indicated per guidelines Pelvic: External genitalia:  no lesions              Urethra:  normal appearing urethra with no masses, tenderness or lesions              Bartholins and Skenes: normal                 Vagina: normal appearing vagina with normal color and discharge, no lesions              Cervix: no lesions Bimanual Exam:  Uterus:  no masses or tenderness              Adnexa: no mass, fullness, tenderness              Rectovaginal: Deferred              Anus:  normal, no lesions   Patient informed chaperone available to be present for breast and pelvic exam. Patient has requested no chaperone to be present. Patient has been advised what will be completed during breast and pelvic exam.   Assessment/Plan:  23 y.o. G0 for annual exam.   Well female exam with routine gynecological exam - Education provided on SBEs, importance of preventative screenings, current guidelines, high calcium diet, regular exercise, and multivitamin daily. Labs with PCP.   PCOS (polycystic ovarian syndrome) - Plan: drospirenone-ethinyl estradiol (YAZ) 3-0.02 MG tablet daily. Good management.   Cervical cancer screening - Plan: Cytology - PAP( [place]). Initial pap today.   Return in about 1 year (around 10/09/2024) for Annual.     [name]  DNP, 11:40 AM 10/10/2023

## 2023-10-11 NOTE — Telephone Encounter (Signed)
 Fax was sent

## 2023-10-14 LAB — CYTOLOGY - PAP

## 2023-10-15 ENCOUNTER — Other Ambulatory Visit: Payer: Self-pay | Admitting: Nurse Practitioner

## 2023-10-15 ENCOUNTER — Encounter: Payer: Self-pay | Admitting: Nurse Practitioner

## 2023-10-15 DIAGNOSIS — B9689 Other specified bacterial agents as the cause of diseases classified elsewhere: Secondary | ICD-10-CM

## 2023-10-15 MED ORDER — METRONIDAZOLE 500 MG PO TABS: 500.0000 mg | ORAL_TABLET | Freq: Two times a day (BID) | ORAL | 0 refills | Status: DC

## 2023-11-19 ENCOUNTER — Emergency Department (HOSPITAL_COMMUNITY)
Admission: EM | Admit: 2023-11-19 | Discharge: 2023-11-20 | Disposition: A | Discharge status: Home / Self Care | Attending: Emergency Medicine | Admitting: Emergency Medicine

## 2023-11-19 ENCOUNTER — Other Ambulatory Visit: Payer: Self-pay

## 2023-11-19 ENCOUNTER — Encounter (HOSPITAL_COMMUNITY): Payer: Self-pay | Admitting: Emergency Medicine

## 2023-11-19 ENCOUNTER — Emergency Department (HOSPITAL_COMMUNITY)

## 2023-11-19 DIAGNOSIS — R079 Chest pain, unspecified: Secondary | ICD-10-CM | POA: Diagnosis not present

## 2023-11-19 DIAGNOSIS — R0789 Other chest pain: Secondary | ICD-10-CM | POA: Diagnosis not present

## 2023-11-19 DIAGNOSIS — G44209 Tension-type headache, unspecified, not intractable: Secondary | ICD-10-CM

## 2023-11-19 DIAGNOSIS — R519 Headache, unspecified: Secondary | ICD-10-CM | POA: Diagnosis present

## 2023-11-19 LAB — CBC
HCT: 44.5 % (ref 36.0–46.0)
Hemoglobin: 14.4 g/dL (ref 12.0–15.0)
MCH: 28.5 pg (ref 26.0–34.0)
MCHC: 32.4 g/dL (ref 30.0–36.0)
MCV: 87.9 fL (ref 80.0–100.0)
Platelets: 374 10*3/uL (ref 150–400)
RBC: 5.06 MIL/uL (ref 3.87–5.11)
RDW: 13 % (ref 11.5–15.5)
WBC: 10.3 10*3/uL (ref 4.0–10.5)
nRBC: 0 % (ref 0.0–0.2)

## 2023-11-19 LAB — BASIC METABOLIC PANEL WITH GFR
Anion gap: 11 (ref 5–15)
BUN: 8 mg/dL (ref 6–20)
CO2: 23 mmol/L (ref 22–32)
Calcium: 9.1 mg/dL (ref 8.9–10.3)
Chloride: 103 mmol/L (ref 98–111)
Creatinine, Ser: 0.82 mg/dL (ref 0.44–1.00)
GFR, Estimated: >60 mL/min (ref >60)
Glucose, Bld: 100 mg/dL — ABNORMAL HIGH (ref 70–99)
Potassium: 3.8 mmol/L (ref 3.5–5.1)
Sodium: 137 mmol/L (ref 135–145)

## 2023-11-19 LAB — HCG, SERUM, QUALITATIVE: Preg, Serum: NEGATIVE

## 2023-11-19 LAB — TROPONIN I (HIGH SENSITIVITY): Troponin I (High Sensitivity): 3 ng/L (ref <18)

## 2023-11-19 NOTE — ED Triage Notes (Signed)
 Patient endorses chest pain on her left side of her chest when she palpates it and headache on the left side of her head since Sunday night.  Patient also states that her chest hurts when she gets anxious.  Patient states pain is unrelieved by tylenol.

## 2023-11-20 LAB — TROPONIN I (HIGH SENSITIVITY): Troponin I (High Sensitivity): <2 ng/L (ref <18)

## 2023-11-20 MED ORDER — DIPHENHYDRAMINE HCL 25 MG PO CAPS
25.0000 mg | ORAL_CAPSULE | Freq: Once | ORAL | Status: AC
  Administered 2023-11-20: 25 mg via ORAL
  Filled 2023-11-20: qty 1

## 2023-11-20 MED ORDER — METOCLOPRAMIDE HCL 10 MG PO TABS
10.0000 mg | ORAL_TABLET | Freq: Once | ORAL | Status: AC
  Administered 2023-11-20: 10 mg via ORAL
  Filled 2023-11-20: qty 1

## 2023-11-20 MED ORDER — KETOROLAC TROMETHAMINE 30 MG/ML IJ SOLN
30.0000 mg | Freq: Once | INTRAMUSCULAR | Status: AC
  Administered 2023-11-20: 30 mg via INTRAMUSCULAR
  Filled 2023-11-20: qty 1

## 2023-11-20 MED ORDER — CYCLOBENZAPRINE HCL 10 MG PO TABS: 10.0000 mg | ORAL_TABLET | Freq: Three times a day (TID) | ORAL | 0 refills | Status: DC | PRN

## 2023-11-20 NOTE — ED Provider Notes (Signed)
 [facility] Emergency Department Provider Note MRN:  [id number]  Arrival date & time: 11/20/23     Chief Complaint   Chest Pain and Headache   History of Present Illness   [name] is a 23 y.o. year-old female presents to the ED with chief complaint of headaches and left sided chest pain.  She states that the symptoms started a couple of days ago.  She states that it keeps her from sleeping well.  She denies any injury.  Denies SOB, cough, or fever.  Has tried some ibuprofen  and Tylenol without much relief.  Denies any neck pain.  History provided by patient.   Review of Systems  Pertinent positive and negative review of systems noted in HPI.    Physical Exam   Vitals:   11/20/23 0530 11/20/23 0600  BP: 120/71 107/63  Pulse: 69 71  Resp: 15 18  Temp:  97.9 F (36.6 C)  SpO2: 100% 100%    CONSTITUTIONAL:  non toxic-appearing, NAD NEURO:  Alert and oriented x 3, CN 3-12 grossly intact EYES:  eyes equal and reactive ENT/NECK:  Supple, no stridor  CARDIO:  normal rate, regular rhythm, appears well-perfused, normal distal pulses bilaterally PULM:  No respiratory distress, CTAB GI/GU:  non-distended, no focal tenderness MSK/SPINE:  No gross deformities, no edema, moves all extremities, there is some tenderness to the left upper chest wall and left trap SKIN:  no rash, atraumatic   *Additional and/or pertinent findings included in MDM below  Diagnostic and Interventional Summary    EKG Interpretation Date/Time:  Tuesday Nov 19 2023 22:32:33 EDT Ventricular Rate:  73 PR Interval:  112 QRS Duration:  86 QT Interval:  358 QTC Calculation: 394 R Axis:   80  Text Interpretation: Normal sinus rhythm with sinus arrhythmia Normal ECG No previous ECGs available Confirmed by Townsend Freud [phone]) on 11/20/2023 2:29:09 AM       Labs Reviewed  BASIC METABOLIC PANEL WITH GFR - Abnormal; Notable for the following components:      Result Value    Glucose, Bld 100 (*)    All other components within normal limits  CBC  HCG, SERUM, QUALITATIVE  TROPONIN I (HIGH SENSITIVITY)  TROPONIN I (HIGH SENSITIVITY)    DG Chest 2 View  Final Result      Medications  ketorolac (TORADOL) 30 MG/ML injection 30 mg (30 mg Intramuscular Given 11/20/23 0605)  metoCLOPramide (REGLAN) tablet 10 mg (10 mg Oral Given 11/20/23 0605)  diphenhydrAMINE (BENADRYL) capsule 25 mg (25 mg Oral Given 11/20/23 0606)     Procedures  /  Critical Care Procedures  ED Course and Medical Decision Making  I have reviewed the triage vital signs, the nursing notes, and pertinent available records from the EMR.  Social Determinants Affecting Complexity of Care: Patient has no clinically significant social determinants affecting this chief complaint..   ED Course: Clinical Course as of 11/20/23 0631  Wed Nov 20, 2023  0600 Troponin I (High Sensitivity) Troponins are negative x 2, doubt ACS [RB]  0600 CBC No leukocytosis or anemia [RB]  0600 Basic metabolic panel(!) No significant electrolyte abnormality [RB]  0600 DG Chest 2 View No opacity or effusion seen on chest x-ray [RB]  0600 EKG 12-Lead Sinus rhythm with sinus arrhythmia, no ischemic changes [RB]    Clinical Course User Index [RB] Sherel Dikes, PA-C    Medical Decision Making Patient here with left-sided headache and left-sided upper chest wall pain.  Symptoms are easily reproducible  with palpation.  Seems musculoskeletal in etiology.  Vitals are stable.  She is afebrile.  Labs ordered in triage are reassuring.  Discussed and ED course.  Will treat patient's pain with Toradol IM.  Will trial treatment with short course of muscle relaxer for home.  Patient reassessed, she states that she is feeling much better after the medication.  She states that she does frequently get left-sided headaches with associated stiffness of her left upper neck and shoulder.  I suspect that she is having tension  headaches.  Lower follow-up with PCP.  Will trial muscle relaxer.  Amount and/or Complexity of Data Reviewed Labs: ordered. Decision-making details documented in ED Course. Radiology: ordered. Decision-making details documented in ED Course. ECG/medicine tests:  Decision-making details documented in ED Course.  Risk Prescription drug management.         Consultants: No consultations were needed in caring for this patient.   Treatment and Plan: Emergency department workup does not suggest an emergent condition requiring admission or immediate intervention beyond  what has been performed at this time. The patient is safe for discharge and has  been instructed to return immediately for worsening symptoms, change in  symptoms or any other concerns    Final Clinical Impressions(s) / ED Diagnoses     ICD-10-CM   1. Tension headache  G44.209     2. Chest wall pain  R07.89       ED Discharge Orders          Ordered    cyclobenzaprine (FLEXERIL) 10 MG tablet  3 times daily PRN        11/20/23 0630              Discharge Instructions Discussed with and Provided to Patient:   Discharge Instructions   None      Sherel Dikes, PA-C 11/20/23 0631    Lindle Rhea, MD 11/20/23 [phone]

## 2023-11-22 ENCOUNTER — Ambulatory Visit: Admitting: Family Medicine

## 2023-11-22 ENCOUNTER — Encounter: Payer: Self-pay | Admitting: Family Medicine

## 2023-11-22 VITALS — BP 108/60 | HR 65 | Temp 97.5°F | Ht 63.5 in | Wt 194.0 lb

## 2023-11-22 DIAGNOSIS — G4700 Insomnia, unspecified: Secondary | ICD-10-CM

## 2023-11-22 DIAGNOSIS — R519 Headache, unspecified: Secondary | ICD-10-CM

## 2023-11-22 LAB — CBC WITH DIFFERENTIAL/PLATELET
Basophils Absolute: 0 10*3/uL (ref 0.0–0.1)
Basophils Relative: 0.4 % (ref 0.0–3.0)
Eosinophils Absolute: 0.1 10*3/uL (ref 0.0–0.7)
Eosinophils Relative: 1 % (ref 0.0–5.0)
HCT: 43.1 % (ref 36.0–46.0)
Hemoglobin: 14.2 g/dL (ref 12.0–15.0)
Lymphocytes Relative: 23.2 % (ref 12.0–46.0)
Lymphs Abs: 1.2 10*3/uL (ref 0.7–4.0)
MCHC: 32.9 g/dL (ref 30.0–36.0)
MCV: 87.3 fl (ref 78.0–100.0)
Monocytes Absolute: 0.5 10*3/uL (ref 0.1–1.0)
Monocytes Relative: 9.6 % (ref 3.0–12.0)
Neutro Abs: 3.4 10*3/uL (ref 1.4–7.7)
Neutrophils Relative %: 65.8 % (ref 43.0–77.0)
Platelets: 333 10*3/uL (ref 150.0–400.0)
RBC: 4.94 Mil/uL (ref 3.87–5.11)
RDW: 13.9 % (ref 11.5–15.5)
WBC: 5.1 10*3/uL (ref 4.0–10.5)

## 2023-11-22 LAB — COMPREHENSIVE METABOLIC PANEL WITH GFR
ALT: 27 U/L (ref 0–35)
AST: 21 U/L (ref 0–37)
Albumin: 4.4 g/dL (ref 3.5–5.2)
Alkaline Phosphatase: 106 U/L (ref 39–117)
BUN: 9 mg/dL (ref 6–23)
CO2: 28 meq/L (ref 19–32)
Calcium: 9.3 mg/dL (ref 8.4–10.5)
Chloride: 104 meq/L (ref 96–112)
Creatinine, Ser: 0.74 mg/dL (ref 0.40–1.20)
GFR: 114.44 mL/min (ref >60.00)
Glucose, Bld: 86 mg/dL (ref 70–99)
Potassium: 3.7 meq/L (ref 3.5–5.1)
Sodium: 140 meq/L (ref 135–145)
Total Bilirubin: 0.3 mg/dL (ref 0.2–1.2)
Total Protein: 7.2 g/dL (ref 6.0–8.3)

## 2023-11-22 LAB — TSH: TSH: 1.58 u[IU]/mL (ref 0.35–5.50)

## 2023-11-22 LAB — MAGNESIUM: Magnesium: 1.9 mg/dL (ref 1.5–2.5)

## 2023-11-22 LAB — SEDIMENTATION RATE: Sed Rate: 2 mm/h (ref 0–20)

## 2023-11-22 MED ORDER — HYDROXYZINE PAMOATE 50 MG PO CAPS
ORAL_CAPSULE | ORAL | 0 refills | Status: DC
Start: 2023-11-22 — End: 2023-12-26

## 2023-11-22 NOTE — Patient Instructions (Addendum)
-  Ordered labs for a reason of headaches. Office will call with lab results and you will see them on MyChart.  -You may continue to take Ibuprofen  and/or Tylenol for headaches. May take Tylenol 1000mg  or Ibuprofen  600-800mg  every 8 hours for headaches. Recommend to eat something when taking Ibuprofen .   -Prescribed Hydroxyzine 50mg  tablet, take 1-2 tablets as needed at bedtime for insomnia.  -Change in schedule and increased insomnia could be a reason for headaches. -Hydrate with 64-100oz of water a day.  -Follow up in 2 weeks.

## 2023-11-22 NOTE — Progress Notes (Signed)
 Established Patient Office Visit   Subjective:  Patient ID: Molly Walker, female    DOB: 2001-05-17  Age: 23 y.o. MRN: [id number]  Chief Complaint  Patient presents with   Medical Management of Chronic Issues    Check up after er visit.     HPI Patient is following up from a ED visit. Patient was seen at [facility] Emergency Department on 11/19/2023 with complaints of headaches and left sided chest pain. She stated the symptoms started a couple days ago and keeps her from sleeping well. Denied SHOB, cough, neck pain, or fever. EKG completed with NSR. Labs completed with no concerns. Toradol  30mg , Reglan  10mg , and Benadryl  25mg  given to help relief symptoms. Patient discharged with Flexeril .   Patient reports chest pain has resolved but still experiencing headaches. She reports she has a history of headaches, but has become more often in the last 2 weeks ago. Daily headaches; left sided headaches, described as pressure, all of a sudden, usually last 1 hour, resolved by Ibuprofen ; Denies blurry vision, dizziness, nausea, vomiting. Denies taking any prescribed medication for headaches, imaging, or seeing neurologist for headaches previously. Changes that have occurred recently is working night shifts and longer hours.  She reports she drinks water, unknown how much, except it is 3 bottles.    ROS See HPI above     Objective:   BP 108/60   Pulse 65   Temp (!) 97.5 F (36.4 C) (Oral)   Ht 5' 3.5" (1.613 m)   Wt 194 lb (88 kg)   LMP 11/10/2023 (Exact Date)   SpO2 98%   BMI 33.83 kg/m    Physical Exam Vitals reviewed.  Constitutional:      General: She is not in acute distress.    Appearance: Normal appearance. She is obese. She is not ill-appearing, toxic-appearing or diaphoretic.  HENT:     Head: Normocephalic and atraumatic.  Eyes:     General:        Right eye: No discharge.        Left eye: No discharge.     Extraocular Movements:     Right eye: Normal  extraocular motion and no nystagmus.     Left eye: Normal extraocular motion and no nystagmus.     Conjunctiva/sclera: Conjunctivae normal.     Pupils: Pupils are equal, round, and reactive to light.  Neck:     Thyroid : No thyromegaly.  Cardiovascular:     Rate and Rhythm: Normal rate and regular rhythm.     Heart sounds: Normal heart sounds. No murmur heard.    No friction rub. No gallop.  Pulmonary:     Effort: Pulmonary effort is normal. No respiratory distress.     Breath sounds: Normal breath sounds.  Musculoskeletal:        General: Normal range of motion.  Skin:    General: Skin is warm and dry.  Neurological:     General: No focal deficit present.     Mental Status: She is alert and oriented to person, place, and time. Mental status is at baseline.     Cranial Nerves: Cranial nerves 2-12 are intact.     Motor: No weakness.     Gait: Gait normal.  Psychiatric:        Mood and Affect: Mood normal.        Behavior: Behavior normal.        Thought Content: Thought content normal.  Judgment: Judgment normal.      Assessment & Plan:  Frequent headaches -     CBC with Differential/Platelet -     Comprehensive metabolic panel with GFR -     Magnesium -     Sedimentation rate -     TSH  Insomnia, unspecified type -     hydrOXYzine Pamoate; Take 1-2 tablet as needed at bedtime for insomnia.  Dispense: 30 capsule; Refill: 0  -Ordered labs (CBC, CMP, Magnesium, Sed Rate, and TSH) for a reason of headaches. Office will call with lab results and you will see them on MyChart.  -Recommend to continue to take Ibuprofen  and/or Tylenol for headaches. May take Tylenol 1000mg  or Ibuprofen  600-800mg  every 8 hours for headaches. Recommend to eat something when taking Ibuprofen .   -Prescribed Hydroxyzine 50mg  tablet, take 1-2 tablets as needed at bedtime for insomnia.  -Change in schedule and increased insomnia could be a reason for headaches. -Hydrate with 64-100oz of water a  day.  -Follow up at next visit with more complaints.  Return in about 2 weeks (around 12/06/2023) for follow-up.   Marvin Grabill, NP

## 2023-12-03 ENCOUNTER — Other Ambulatory Visit: Payer: Self-pay | Admitting: Medical Genetics

## 2023-12-10 ENCOUNTER — Ambulatory Visit: Admitting: Family Medicine

## 2023-12-10 ENCOUNTER — Encounter: Payer: Self-pay | Admitting: Family Medicine

## 2023-12-10 VITALS — BP 118/78 | HR 80 | Temp 97.6°F | Ht 63.5 in | Wt 193.0 lb

## 2023-12-10 DIAGNOSIS — L678 Other hair color and hair shaft abnormalities: Secondary | ICD-10-CM

## 2023-12-10 DIAGNOSIS — L309 Dermatitis, unspecified: Secondary | ICD-10-CM | POA: Diagnosis not present

## 2023-12-10 DIAGNOSIS — R519 Headache, unspecified: Secondary | ICD-10-CM

## 2023-12-10 DIAGNOSIS — G4700 Insomnia, unspecified: Secondary | ICD-10-CM | POA: Diagnosis not present

## 2023-12-10 NOTE — Patient Instructions (Signed)
 It was a pleasure seeing you today!  Continue taking hydroxyzine  for insomnia and improve rest to decrease headaches. -Continue drinking 64-100 oz of water daily for hydration.  - Apply moisturizer several times a day. Prefer Eucerin or gold bond cream. -I recommend exfoliation of the skin and use non-scented soaps and lotions. - Dermatology referral ordered. Expect a call within the next 2 weeks. If you have not heard back please follow-up.   Follow-up in 9 months for an annual physical, or sooner if needed.

## 2023-12-10 NOTE — Progress Notes (Signed)
 Acute Office Visit  Subjective:     Patient ID: Molly Walker, female    DOB: Jul 11, 2001, 23 y.o.   MRN: [id number]  Chief Complaint  Patient presents with   Medical Management of Chronic Issues    HPI Patient is in today for medication follow-up. Patient was prescribed hydroxyzine  2 weeks ago for insomnia and headaches. Patient states that she is resting well and still gets headaches occasionally. Patient has seen improvement.  Patient has complaints of bilateral upper arm rash. Scented lotion aggravates symptoms and nothing seems to alleviate symptoms. Itchy occurs when scented lotion is applied. Patient is currently using Dove soap when she bathes. Has tried unscented Aveeno which does not irritate the rash. Denies new lotions, soaps, or detergents.   Patient has complaints of facial hair. Patient has been diagnosed with PCOS in the past. Interested in learning what she can do for the facial hair. Patient is currently plucking the hair under her chin.  Review of Systems  Constitutional:  Negative for chills, fever and weight loss.  HENT:  Negative for congestion and sinus pain.   Respiratory:  Negative for cough.   Cardiovascular:  Negative for chest pain and palpitations.  Genitourinary:  Negative for dysuria.  Skin:  Positive for itching and rash.  Neurological:  Negative for dizziness and headaches.        Objective:    BP 118/78   Pulse 80   Temp 97.6 F (36.4 C) (Oral)   Ht 5' 3.5" (1.613 m)   Wt 193 lb (87.5 kg)   LMP 11/10/2023 (Exact Date)   SpO2 97%   BMI 33.65 kg/m       Physical Exam Constitutional:      Appearance: Normal appearance.  HENT:     Head: Normocephalic and atraumatic.  Eyes:     Extraocular Movements: Extraocular movements intact.     Pupils: Pupils are equal, round, and reactive to light.  Cardiovascular:     Rate and Rhythm: Normal rate and regular rhythm.     Heart sounds: Normal heart sounds.  Pulmonary:     Effort: Pulmonary  effort is normal.     Breath sounds: Normal breath sounds.  Musculoskeletal:        General: Normal range of motion.     Cervical back: Normal range of motion.  Skin:    General: Skin is warm.     Capillary Refill: Capillary refill takes less than 2 seconds.  Neurological:     Mental Status: She is alert.  Psychiatric:        Mood and Affect: Mood normal.        Behavior: Behavior normal.     No results found for any visits on 12/10/23.      Assessment & Plan:   Problem List Items Addressed This Visit   None Visit Diagnoses       Dermatitis    -  Primary     Insomnia, unspecified type         Frequent headaches         Abnormal facial hair           No orders of the defined types were placed in this encounter.  Insomnia/ headache -Continue taking hydroxyzine  for insomnia and improve rest to decrease headaches. -Continue drinking 64-100 oz of water daily.   Dermatitis/ abnormal facial hair - Apply moisturizer several times a day. Prefer Eucerin or gold bond cream. -Exfoliate skin and use non-scented soaps and  lotions.  -Dermatology referral ordered. Return in about 9 months (around 09/11/2024) for yearly physical.  Janeann Mean, RN

## 2023-12-19 ENCOUNTER — Other Ambulatory Visit

## 2023-12-26 ENCOUNTER — Other Ambulatory Visit: Payer: Self-pay | Admitting: Family Medicine

## 2023-12-26 DIAGNOSIS — G4700 Insomnia, unspecified: Secondary | ICD-10-CM

## 2024-01-22 ENCOUNTER — Other Ambulatory Visit: Payer: Self-pay | Admitting: Family Medicine

## 2024-01-22 DIAGNOSIS — G4700 Insomnia, unspecified: Secondary | ICD-10-CM

## 2024-02-10 ENCOUNTER — Emergency Department (HOSPITAL_COMMUNITY)
Admission: EM | Admit: 2024-02-10 | Discharge: 2024-02-10 | Disposition: A | Discharge status: Home / Self Care | Attending: Emergency Medicine | Admitting: Emergency Medicine

## 2024-02-10 ENCOUNTER — Other Ambulatory Visit: Payer: Self-pay

## 2024-02-10 ENCOUNTER — Encounter (HOSPITAL_COMMUNITY): Payer: Self-pay

## 2024-02-10 ENCOUNTER — Emergency Department (HOSPITAL_COMMUNITY)

## 2024-02-10 DIAGNOSIS — R519 Headache, unspecified: Secondary | ICD-10-CM | POA: Diagnosis not present

## 2024-02-10 DIAGNOSIS — G43909 Migraine, unspecified, not intractable, without status migrainosus: Secondary | ICD-10-CM

## 2024-02-10 DIAGNOSIS — R079 Chest pain, unspecified: Secondary | ICD-10-CM | POA: Diagnosis not present

## 2024-02-10 DIAGNOSIS — R0789 Other chest pain: Secondary | ICD-10-CM | POA: Diagnosis not present

## 2024-02-10 LAB — BASIC METABOLIC PANEL WITH GFR
Anion gap: 9 (ref 5–15)
BUN: 9 mg/dL (ref 6–20)
CO2: 24 mmol/L (ref 22–32)
Calcium: 9 mg/dL (ref 8.9–10.3)
Chloride: 106 mmol/L (ref 98–111)
Creatinine, Ser: 0.68 mg/dL (ref 0.44–1.00)
GFR, Estimated: >60 mL/min (ref >60)
Glucose, Bld: 88 mg/dL (ref 70–99)
Potassium: 4.1 mmol/L (ref 3.5–5.1)
Sodium: 139 mmol/L (ref 135–145)

## 2024-02-10 LAB — CBC
HCT: 43.1 % (ref 36.0–46.0)
Hemoglobin: 14.1 g/dL (ref 12.0–15.0)
MCH: 28.8 pg (ref 26.0–34.0)
MCHC: 32.7 g/dL (ref 30.0–36.0)
MCV: 88.1 fL (ref 80.0–100.0)
Platelets: 381 K/uL (ref 150–400)
RBC: 4.89 MIL/uL (ref 3.87–5.11)
RDW: 13.1 % (ref 11.5–15.5)
WBC: 9.7 K/uL (ref 4.0–10.5)
nRBC: 0 % (ref 0.0–0.2)

## 2024-02-10 LAB — HCG, SERUM, QUALITATIVE: Preg, Serum: NEGATIVE

## 2024-02-10 LAB — TROPONIN I (HIGH SENSITIVITY): Troponin I (High Sensitivity): 3 ng/L (ref <18)

## 2024-02-10 MED ORDER — METOCLOPRAMIDE HCL 5 MG/ML IJ SOLN
5.0000 mg | Freq: Once | INTRAMUSCULAR | Status: AC
  Administered 2024-02-10: 5 mg via INTRAVENOUS
  Filled 2024-02-10: qty 2

## 2024-02-10 MED ORDER — KETOROLAC TROMETHAMINE 15 MG/ML IJ SOLN
15.0000 mg | Freq: Once | INTRAMUSCULAR | Status: AC
  Administered 2024-02-10: 15 mg via INTRAVENOUS
  Filled 2024-02-10: qty 1

## 2024-02-10 NOTE — ED Provider Notes (Signed)
 [place] EMERGENCY DEPARTMENT AT [facility] Provider Note   CSN: [id number] Arrival date & time: 02/10/24  1128     Patient presents with: Migraine and Chest Pain   Molly Walker is a 23 y.o. female.   23 year old female presents with her usual migraine which she describes as throbbing headache on the right side of her head.  States that she has had decreased sleep recently and feels that this is caused her to have migraine exacerbation.  Also notes intermittent sharp left-sided chest pain which only last for seconds to the has no anginal quality or PE qualities to it.  Has been using ibuprofen  with limited relief.  Denies any new focal weakness.  No fever or photophobia.       Prior to Admission medications   Medication Sig Start Date End Date Taking? Authorizing Provider  cyclobenzaprine  (FLEXERIL ) 10 MG tablet Take 1 tablet (10 mg total) by mouth 3 (three) times daily as needed for muscle spasms. 11/20/23   Vicky Charleston, PA-C  drospirenone -ethinyl estradiol  (YAZ) 3-0.02 MG tablet Take 1 tablet by mouth daily. 10/10/23   Prentiss Annabella LABOR, NP  hydrOXYzine  (VISTARIL ) 50 MG capsule TAKE 1 TO 2 CAPSULES BY MOUTH AS NEEDED AT BEDTIME FOR INSOMNIA 01/22/24   Williamson, Joanna R, NP  naproxen (NAPROSYN) 500 MG tablet Take 500 mg by mouth 2 (two) times daily. 09/10/23   [provider]    Allergies: Patient has no known allergies.    Review of Systems  All other systems reviewed and are negative.   Updated Vital Signs BP 130/78 (BP Location: Right Arm)   Pulse 83   Temp (!) 97.5 F (36.4 C)   Resp 16   Ht 1.6 m (5' 3)   Wt 86.2 kg   LMP 01/05/2024 (Within Weeks)   SpO2 100%   BMI 33.66 kg/m   Physical Exam Vitals and nursing note reviewed.  Constitutional:      General: She is not in acute distress.    Appearance: Normal appearance. She is well-developed. She is not toxic-appearing.  HENT:     Head: Normocephalic and atraumatic.  Eyes:      General: Lids are normal.     Conjunctiva/sclera: Conjunctivae normal.     Pupils: Pupils are equal, round, and reactive to light.  Neck:     Thyroid : No thyroid  mass.     Trachea: No tracheal deviation.  Cardiovascular:     Rate and Rhythm: Normal rate and regular rhythm.     Heart sounds: Normal heart sounds. No murmur heard.    No gallop.  Pulmonary:     Effort: Pulmonary effort is normal. No respiratory distress.     Breath sounds: Normal breath sounds. No stridor. No decreased breath sounds, wheezing, rhonchi or rales.  Abdominal:     General: There is no distension.     Palpations: Abdomen is soft.     Tenderness: There is no abdominal tenderness. There is no rebound.  Musculoskeletal:        General: No tenderness. Normal range of motion.     Cervical back: Normal range of motion and neck supple.  Skin:    General: Skin is warm and dry.     Findings: No abrasion or rash.  Neurological:     General: No focal deficit present.     Mental Status: She is alert and oriented to person, place, and time. Mental status is at baseline.     GCS: GCS eye  subscore is 4. GCS verbal subscore is 5. GCS motor subscore is 6.     Cranial Nerves: No cranial nerve deficit.     Sensory: No sensory deficit.     Motor: Motor function is intact.     Gait: Gait is intact.  Psychiatric:        Attention and Perception: Attention normal.        Speech: Speech normal.        Behavior: Behavior normal.     (all labs ordered are listed, but only abnormal results are displayed) Labs Reviewed  CBC  HCG, SERUM, QUALITATIVE  BASIC METABOLIC PANEL WITH GFR  TROPONIN I (HIGH SENSITIVITY)    EKG: EKG Interpretation Date/Time:  Monday February 10 2024 11:35:37 EDT Ventricular Rate:  87 PR Interval:  112 QRS Duration:  84 QT Interval:  348 QTC Calculation: 418 R Axis:   84  Text Interpretation: Normal sinus rhythm Normal ECG When compared with ECG of 19-Nov-2023 22:32, PREVIOUS ECG IS PRESENT No  significant change since last tracing Confirmed by Dasie Faden ([id number]) on 02/10/2024 12:34:52 PM  Radiology: No results found.   Procedures   Medications Ordered in the ED  metoCLOPramide  (REGLAN ) injection 5 mg (has no administration in time range)  ketorolac  (TORADOL ) 15 MG/ML injection 15 mg (has no administration in time range)                                    Medical Decision Making Amount and/or Complexity of Data Reviewed Labs: ordered. Radiology: ordered.  Risk Prescription drug management.   Patient's EKG shows normal sinus rhythm.  No ischemic changes noted.  Chest x-ray without acute findings here.  Troponin is negative here.  Low suspicion for ACS.  Migraine treated and she feels much better.  Do not feel she needs intracranial imaging.  Will discharge home     Final diagnoses:  None    ED Discharge Orders     None          Dasie Faden, MD 02/10/24 1404

## 2024-02-10 NOTE — ED Triage Notes (Signed)
 POV/ ambulatory/ c/o migraine that started 2 hours ago/ with left eye pain and blurriness/ also c/o CP that started last night/ A&Ox4

## 2024-03-06 ENCOUNTER — Other Ambulatory Visit: Payer: Self-pay

## 2024-03-06 ENCOUNTER — Emergency Department (HOSPITAL_COMMUNITY)
Admission: EM | Admit: 2024-03-06 | Discharge: 2024-03-06 | Disposition: A | Discharge status: Home / Self Care | Attending: Emergency Medicine | Admitting: Emergency Medicine

## 2024-03-06 ENCOUNTER — Encounter (HOSPITAL_COMMUNITY): Payer: Self-pay | Admitting: *Deleted

## 2024-03-06 DIAGNOSIS — R519 Headache, unspecified: Secondary | ICD-10-CM | POA: Diagnosis not present

## 2024-03-06 MED ORDER — KETOROLAC TROMETHAMINE 15 MG/ML IJ SOLN
15.0000 mg | Freq: Once | INTRAMUSCULAR | Status: AC
  Administered 2024-03-06: 15 mg via INTRAVENOUS
  Filled 2024-03-06: qty 1

## 2024-03-06 MED ORDER — METOCLOPRAMIDE HCL 5 MG/ML IJ SOLN
10.0000 mg | Freq: Once | INTRAMUSCULAR | Status: AC
  Administered 2024-03-06: 10 mg via INTRAVENOUS
  Filled 2024-03-06: qty 2

## 2024-03-06 MED ORDER — LACTATED RINGERS IV BOLUS
1000.0000 mL | Freq: Once | INTRAVENOUS | Status: AC
  Administered 2024-03-06: 1000 mL via INTRAVENOUS

## 2024-03-06 MED ORDER — MAGNESIUM SULFATE 2 GM/50ML IV SOLN
2.0000 g | Freq: Once | INTRAVENOUS | Status: AC
  Administered 2024-03-06: 2 g via INTRAVENOUS
  Filled 2024-03-06: qty 50

## 2024-03-06 MED ORDER — ACETAMINOPHEN 325 MG PO TABS
650.0000 mg | ORAL_TABLET | Freq: Once | ORAL | Status: AC
  Administered 2024-03-06: 650 mg via ORAL
  Filled 2024-03-06: qty 2

## 2024-03-06 NOTE — ED Provider Notes (Signed)
 [facility] Provider Note   CSN: [id number] Arrival date & time: 03/06/24  1851     Patient presents with: Migraine   Molly Walker is a 23 y.o. female past medical history significant for migraines who presents emergency department for recurrent headache.  Patient states that she has a history of migraines in which she has associated chest pain and left upper extremity numbness.  Patient states that she has had a headache for approximately 4 days that has been constant and gradually worsening.  Patient states she has taken ibuprofen  with last ibuprofen  dose this a.m. without relief.  Patient states that she currently has a headache however does not have associated symptoms including numbness or tingling of extremities, weakness, chest pain, shortness of breath, cough, rhinorrhea, fever.  Patient denies any recent head strike, falls or trauma.    Migraine       Prior to Admission medications   Medication Sig Start Date End Date Taking? Authorizing Provider  cyclobenzaprine  (FLEXERIL ) 10 MG tablet Take 1 tablet (10 mg total) by mouth 3 (three) times daily as needed for muscle spasms. 11/20/23   Vicky Charleston, PA-C  drospirenone -ethinyl estradiol  (YAZ) 3-0.02 MG tablet Take 1 tablet by mouth daily. 10/10/23   Prentiss Annabella LABOR, NP  hydrOXYzine  (VISTARIL ) 50 MG capsule TAKE 1 TO 2 CAPSULES BY MOUTH AS NEEDED AT BEDTIME FOR INSOMNIA 01/22/24   Williamson, Joanna R, NP  naproxen (NAPROSYN) 500 MG tablet Take 500 mg by mouth 2 (two) times daily. 09/10/23   [provider]    Allergies: Patient has no known allergies.    Review of Systems  Updated Vital Signs BP 112/68   Pulse 82   Temp 98.2 F (36.8 C)   Resp 14   Ht 5' 3 (1.6 m)   Wt 86.2 kg   LMP 03/05/2024 (Within Weeks)   SpO2 100%   BMI 33.66 kg/m   Physical Exam Vitals and nursing note reviewed.  Constitutional:      General: She is not in acute distress.     Appearance: Normal appearance. She is not ill-appearing.  Eyes:     General: No visual field deficit. Cardiovascular:     Rate and Rhythm: Normal rate.     Pulses:          Radial pulses are 2+ on the right side and 2+ on the left side.  Pulmonary:     Effort: Pulmonary effort is normal. No tachypnea.  Musculoskeletal:     Right lower leg: No edema.     Left lower leg: No edema.     Comments: Spontaneous movement of bilateral upper and lower extremities  Neurological:     General: No focal deficit present.     Mental Status: She is alert and oriented to person, place, and time.     Cranial Nerves: No cranial nerve deficit, dysarthria or facial asymmetry.     Sensory: Sensation is intact. No sensory deficit.     Motor: Motor function is intact. No weakness, tremor, abnormal muscle tone, seizure activity or pronator drift.     Coordination: Coordination is intact. Finger-Nose-Finger Test normal.     Gait: Gait is intact.     Deep Tendon Reflexes: Reflexes normal.     (all labs ordered are listed, but only abnormal results are displayed) Labs Reviewed - No data to display  EKG: None  Radiology: No results found.   Procedures   Medications Ordered in the  ED  lactated ringers  bolus 1,000 mL (0 mLs Intravenous Stopped 03/06/24 2226)  metoCLOPramide  (REGLAN ) injection 10 mg (10 mg Intravenous Given 03/06/24 2118)  ketorolac  (TORADOL ) 15 MG/ML injection 15 mg (15 mg Intravenous Given 03/06/24 2117)  acetaminophen  (TYLENOL ) tablet 650 mg (650 mg Oral Given 03/06/24 2117)  magnesium  sulfate IVPB 2 g 50 mL (0 g Intravenous Stopped 03/06/24 2226)                                    Medical Decision Making Risk OTC drugs. Prescription drug management.   On initial evaluation patient is hemodynamically stable, afebrile, not in acute distress and has no focal neurologic deficits on examination.  Based upon patient's history differential diagnosis includes intractable versus  non-intractable headache, CVA/TIA, acute viral illness, dural venous sinus thrombosis.  As patient is hemodynamically stable, afebrile without recent sick signs or symptoms and no evidence of focal neurologic deficits on examination do not believe patient necessitates laboratory studies or imaging at this time.  Will give patient headache cocktail and reassess.  Upon reassessment after headache cocktail patient's headache has completely resolved and patient remains hemodynamically stable, afebrile and without focal neurologic deficits on examination.  As patient's headache has significantly improved believe patient's presentation is secondary to acute non-intractable headache.  At this time believe patient is safe to be discharged home with strict return precautions and recommendations to follow-up with patient's PCP for long-term migraine management     Final diagnoses:  Acute nonintractable headache, unspecified headache type    ED Discharge Orders     None       Lavanda Bolster DO Emergency Medicine PGY2    Bolster Lavanda, DO 03/06/24 2338    Randol Simmonds, MD 03/07/24 [phone]

## 2024-03-06 NOTE — Discharge Instructions (Signed)
 Thank you for allowing us  to care for you today.  Please be sure to follow-up with your primary care provider regarding medications you may take for migraine headaches.  Please return to emergency department if you experience worsening pain, chest pain, shortness of breath, passout or believe you need emergent medical care

## 2024-03-06 NOTE — ED Triage Notes (Signed)
 The pt has had a migraine headache for 3-4 days and she reports that she has had chest pain for 30 minutes  no cardiac history  she also has nerve problems  lmp now

## 2024-03-18 ENCOUNTER — Ambulatory Visit: Payer: Self-pay

## 2024-03-18 NOTE — Telephone Encounter (Signed)
 noted

## 2024-03-18 NOTE — Telephone Encounter (Signed)
 FYI Only or Action Required?: FYI only for provider.  Patient was last seen in primary care on 12/10/2023 by Billy Philippe SAUNDERS, NP.  Called Nurse Triage reporting Chest Pain.  Symptoms began several weeks ago.  Interventions attempted: Other: was evaluated in the ED for the same.  Symptoms are: gradually worsening.  Triage Disposition: See Physician Within 24 Hours  Patient/caregiver understands and will follow disposition?: Yes with modifications. Patient seen in the ED for the same, appointment scheduled for 9/5.       Patient transferred to NT by [facility] office to be triaged for chest pain, headache, and numbness.        Reason for Disposition  [1] Chest pain lasts > 5 minutes AND [2] occurred > 3 days ago (72 hours) AND [3] NO chest pain or cardiac symptoms now    Patient seen in the ED for the same, appointment scheduled for 9/4  Answer Assessment - Initial Assessment Questions 1. LOCATION: Where does it hurt?       Upper left sided chest  2. RADIATION: Does the pain go anywhere else? (e.g., into neck, jaw, arms, back)     Back of neck that is causing headaches  3. ONSET: When did the chest pain begin? (Minutes, hours or days)      2 weeks ago  4. PATTERN: Does the pain come and go, or has it been constant since it started?  Does it get worse with exertion?      Intermittent  5. DURATION: How long does it last (e.g., seconds, minutes, hours)     30-60 minutes  6. SEVERITY: How bad is the pain?  (e.g., Scale 1-10; mild, moderate, or severe)     8/10  7. CARDIAC RISK FACTORS: Do you have any history of heart problems or risk factors for heart disease? (e.g., angina, prior heart attack; diabetes, high blood pressure, high cholesterol, smoker, or strong family history of heart disease)     No 8. PULMONARY RISK FACTORS: Do you have any history of lung disease?  (e.g., blood clots in lung, asthma, emphysema, birth control pills)     No 9.  CAUSE: What do you think is causing the chest pain?     Unsure  10. OTHER SYMPTOMS: Do you have any other symptoms? (e.g., dizziness, nausea, vomiting, sweating, fever, difficulty breathing, cough)       Headaches, intermittent left arm numbness in fingers  Protocols used: Chest Pain-A-AH

## 2024-03-20 ENCOUNTER — Ambulatory Visit: Admitting: Adult Health

## 2024-03-20 ENCOUNTER — Encounter: Payer: Self-pay | Admitting: Adult Health

## 2024-03-20 VITALS — BP 120/82 | HR 83 | Temp 98.4°F | Ht 63.0 in | Wt 193.0 lb

## 2024-03-20 DIAGNOSIS — G43809 Other migraine, not intractable, without status migrainosus: Secondary | ICD-10-CM | POA: Diagnosis not present

## 2024-03-20 MED ORDER — AMITRIPTYLINE HCL 25 MG PO TABS: 25.0000 mg | ORAL_TABLET | Freq: Every day | ORAL | 0 refills | Status: DC

## 2024-03-20 NOTE — Progress Notes (Signed)
 Subjective:    Patient ID: Molly Walker, female    DOB: [birth year], 23 y.o.   MRN: [id number]  HPI  Discussed the use of AI scribe software for clinical note transcription with the patient, who gave verbal consent to proceed.  History of Present Illness   Molly Walker is a 23 year old female who presents with migraine headaches.   She was recently seen in the ER with her typical symptoms of headache, chest pain and numbness in her left arm. Workup was negative(( but she did not have any imaging)  and she was given a headache cocktail which resolved her headache for about a day.    She has been experiencing a daily headache for about four months.  Ibuprofen  is ineffective       Review of Systems See HPI   Past Medical History:  Diagnosis Date   Medical history non-contributory    PCOS (polycystic ovarian syndrome)    Vitamin D  deficiency     Social History   Socioeconomic History   Marital status: Single    Spouse name: Not on file   Number of children: 0   Years of education: Not on file   Highest education level: High school graduate  Occupational History   Not on file  Tobacco Use   Smoking status: Never   Smokeless tobacco: Never  Vaping Use   Vaping status: Never Used  Substance and Sexual Activity   Alcohol use: Yes    Comment: Once every 2 weeks   Drug use: Never   Sexual activity: Not Currently    Partners: Male    Birth control/protection: Pill    Comment: Menarche @ 23 y/o, First IC @ 49, No hx of STIs  Other Topics Concern   Not on file  Social History Narrative   Not on file   Social Drivers of Health   Financial Resource Strain: Low Risk  (05/02/2023)   Overall Financial Resource Strain (CARDIA)    Difficulty of Paying Living Expenses: Not very hard  Food Insecurity: No Food Insecurity (05/02/2023)   Hunger Vital Sign    Worried About Running Out of Food in the Last Year: Never true    Ran Out of Food in the Last Year: Never true   Transportation Needs: No Transportation Needs (05/02/2023)   PRAPARE - Administrator, Civil Service (Medical): No    Lack of Transportation (Non-Medical): No  Physical Activity: Sufficiently Active (05/02/2023)   Exercise Vital Sign    Days of Exercise per Week: 4 days    Minutes of Exercise per Session: 90 min  Stress: Stress Concern Present (05/02/2023)   [employer] of Occupational Health - Occupational Stress Questionnaire    Feeling of Stress : To some extent  Social Connections: Moderately Isolated (05/02/2023)   Social Connection and Isolation Panel    Frequency of Communication with Friends and Family: More than three times a week    Frequency of Social Gatherings with Friends and Family: Twice a week    Attends Religious Services: 1 to 4 times per year    Active Member of [employer] or Organizations: No    Attends Banker Meetings: Never    Marital Status: Never married  Intimate Partner Violence: Not At Risk (05/02/2023)   Humiliation, Afraid, Rape, and Kick questionnaire    Fear of Current or Ex-Partner: No    Emotionally Abused: No    Physically Abused: No    Sexually Abused:  No    History reviewed. No pertinent surgical history.  Family History  Problem Relation Age of Onset   Thyroid  disease Sister    Thyroid  disease Maternal Grandmother    Hypertension Paternal Grandmother    Hypertension Paternal Grandfather    Diabetes Paternal Grandfather    Diabetes Paternal Uncle        4 uncles   Asthma Neg Hx    Cancer Neg Hx    Depression Neg Hx     No Known Allergies  Current Outpatient Medications on File Prior to Visit  Medication Sig Dispense Refill   cyclobenzaprine  (FLEXERIL ) 10 MG tablet Take 1 tablet (10 mg total) by mouth 3 (three) times daily as needed for muscle spasms. 10 tablet 0   drospirenone -ethinyl estradiol  (YAZ) 3-0.02 MG tablet Take 1 tablet by mouth daily. 84 tablet 3   hydrOXYzine  (VISTARIL ) 50 MG capsule TAKE 1  TO 2 CAPSULES BY MOUTH AS NEEDED AT BEDTIME FOR INSOMNIA 30 capsule 0   naproxen (NAPROSYN) 500 MG tablet Take 500 mg by mouth 2 (two) times daily.     No current facility-administered medications on file prior to visit.    BP 120/82   Pulse 83   Temp 98.4 F (36.9 C) (Oral)   Ht 5' 3 (1.6 m)   Wt 193 lb (87.5 kg)   LMP 03/05/2024 (Within Weeks)   SpO2 96%   BMI 34.19 kg/m       Objective:   Physical Exam Constitutional:      Appearance: She is well-developed. She is obese.  Eyes:     Extraocular Movements: Extraocular movements intact.     Pupils: Pupils are equal, round, and reactive to light.  Cardiovascular:     Rate and Rhythm: Normal rate.     Heart sounds: Normal heart sounds.  Pulmonary:     Effort: Pulmonary effort is normal.     Breath sounds: Normal breath sounds.  Neurological:     Mental Status: She is alert.  Psychiatric:        Mood and Affect: Mood normal.        Behavior: Behavior normal.       Assessment & Plan:  1. Other migraine without status migrainosus, not intractable (Primary) - Will place her on Elavil  25 mg at bedtime for migraine headaches. She is also having issues with sleep and Visteral has been ineffective so hopefully this helps with that as well.  Samples of Nurtec were given to the patient, quantity 3, Lot Number [id number] The patient has been instructed regarding the correct time, dose, and frequency of taking this medication, including desired effects and most common side effects.  - amitriptyline  (ELAVIL ) 25 MG tablet; Take 1 tablet (25 mg total) by mouth at bedtime.  Dispense: 30 tablet; Refill: 0 - Follow up with PCP in 2-4 weeks  Darleene Shape, NP  I personally spent a total of 31 minutes in the care of the patient today including preparing to see the patient, getting/reviewing separately obtained history, performing a medically appropriate exam/evaluation, and documenting clinical information in the EHR.

## 2024-03-23 ENCOUNTER — Ambulatory Visit (INDEPENDENT_AMBULATORY_CARE_PROVIDER_SITE_OTHER): Admitting: Nurse Practitioner

## 2024-03-23 ENCOUNTER — Encounter: Payer: Self-pay | Admitting: Nurse Practitioner

## 2024-03-23 VITALS — BP 120/78 | HR 70 | Ht 63.0 in | Wt 190.0 lb

## 2024-03-23 DIAGNOSIS — N926 Irregular menstruation, unspecified: Secondary | ICD-10-CM | POA: Diagnosis not present

## 2024-03-23 DIAGNOSIS — E282 Polycystic ovarian syndrome: Secondary | ICD-10-CM | POA: Diagnosis not present

## 2024-03-23 LAB — PREGNANCY, URINE: Preg Test, Ur: NEGATIVE

## 2024-03-23 MED ORDER — DROSPIRENONE-ETHINYL ESTRADIOL 3-0.03 MG PO TABS
1.0000 | ORAL_TABLET | Freq: Every day | ORAL | 1 refills | Status: AC
Start: 2024-03-23 — End: ?

## 2024-03-23 NOTE — Progress Notes (Signed)
   Acute Office Visit  Subjective:    Patient ID: Molly Walker, female    DOB: 2000-11-26, 23 y.o.   MRN: [id number]   HPI 23 y.o. G0 presents today for PCOS. Did not have menses in July or August. LMP in mid June. Has been having monthly cycles since restarting OCPs in December until now. No other concerns. Normal TSH in May (family history). Normal A1c.  Patient's last menstrual period was 12/29/2023 (within weeks). Period Cycle (Days): 28 Period Duration (Days): 5 Period Pattern: (!) Irregular Menstrual Flow: Heavy Menstrual Control: Maxi pad Menstrual Control Change Freq (Hours): 3 Dysmenorrhea: (!) Mild Dysmenorrhea Symptoms: Cramping, Headache (migraines)  Review of Systems  Constitutional: Negative.   Genitourinary:  Positive for menstrual problem.       Objective:    Physical Exam Constitutional:      Appearance: Normal appearance.   GU: Not indicated  BP 120/78 (BP Location: Left Arm, Patient Position: Sitting, Cuff Size: Normal)   Pulse 70   Ht 5' 3 (1.6 m)   Wt 190 lb (86.2 kg)   LMP 12/29/2023 (Within Weeks)   SpO2 99%   BMI 33.66 kg/m  Wt Readings from Last 3 Encounters:  03/23/24 190 lb (86.2 kg)  03/20/24 193 lb (87.5 kg)  03/06/24 190 lb 0.6 oz (86.2 kg)        UPT neg  Assessment & Plan:   Problem List Items Addressed This Visit       Endocrine   PCOS (polycystic ovarian syndrome)   Relevant Medications   drospirenone -ethinyl estradiol  (YASMIN  28) 3-0.03 MG tablet   Other Visit Diagnoses       Missed periods    -  Primary   Relevant Orders   Pregnancy, urine      Plan: Will increase OCPs. Taking as prescribed. Will reach out if no menses after dose change. UPT negative.   Return if symptoms worsen or fail to improve.    Annabella DELENA Shutter DNP, 2:12 PM 03/23/2024

## 2024-04-01 ENCOUNTER — Inpatient Hospital Stay: Admitting: Family Medicine

## 2024-04-21 ENCOUNTER — Inpatient Hospital Stay: Admitting: Family Medicine

## 2024-04-29 ENCOUNTER — Other Ambulatory Visit: Payer: Self-pay | Admitting: Medical Genetics

## 2024-04-29 DIAGNOSIS — Z006 Encounter for examination for normal comparison and control in clinical research program: Secondary | ICD-10-CM

## 2024-05-12 ENCOUNTER — Encounter: Payer: Self-pay | Admitting: Family Medicine

## 2024-05-12 ENCOUNTER — Ambulatory Visit: Admitting: Family Medicine

## 2024-05-12 VITALS — BP 124/76 | HR 82 | Temp 97.7°F | Ht 63.0 in | Wt 194.0 lb

## 2024-05-12 DIAGNOSIS — Z23 Encounter for immunization: Secondary | ICD-10-CM

## 2024-05-12 DIAGNOSIS — G43909 Migraine, unspecified, not intractable, without status migrainosus: Secondary | ICD-10-CM | POA: Insufficient documentation

## 2024-05-12 DIAGNOSIS — G43809 Other migraine, not intractable, without status migrainosus: Secondary | ICD-10-CM

## 2024-05-12 MED ORDER — AMITRIPTYLINE HCL 25 MG PO TABS: 25.0000 mg | ORAL_TABLET | Freq: Every day | ORAL | 1 refills | Status: DC

## 2024-05-12 NOTE — Progress Notes (Signed)
   Established Patient Office Visit   Subjective:  Patient ID: Molly Walker, female    DOB: 06-08-2001  Age: 23 y.o. MRN: [id number]  Chief Complaint  Patient presents with   Medical Management of Chronic Issues    Follow up on headaches    HPI Patient is following up on headaches. She was experiencing daily headaches for months. Some of it was thought to be related to transitioning to night shift. However, she has transitioned back to day shift. She was seen by Darleene, NP within the same practice this provider was not available. She was placed on Amitriptyline  25mg  daily at bedtime. She reports her headaches have improved, occuring 2-3 times a week and they are manageable.  ROS See HPI above     Objective:   BP 124/76   Pulse 82   Temp 97.7 F (36.5 C) (Oral)   Ht 5' 3 (1.6 m)   Wt 194 lb (88 kg)   SpO2 97%   BMI 34.37 kg/m    Physical Exam Vitals reviewed.  Constitutional:      General: She is not in acute distress.    Appearance: Normal appearance. She is obese. She is not ill-appearing, toxic-appearing or diaphoretic.  HENT:     Head: Normocephalic and atraumatic.  Eyes:     General:        Right eye: No discharge.        Left eye: No discharge.     Conjunctiva/sclera: Conjunctivae normal.  Cardiovascular:     Rate and Rhythm: Normal rate and regular rhythm.     Heart sounds: Normal heart sounds. No murmur heard.    No friction rub. No gallop.  Pulmonary:     Effort: Pulmonary effort is normal. No respiratory distress.     Breath sounds: Normal breath sounds.  Musculoskeletal:        General: Normal range of motion.  Skin:    General: Skin is warm and dry.  Neurological:     General: No focal deficit present.     Mental Status: She is alert and oriented to person, place, and time. Mental status is at baseline.     Motor: No weakness.  Psychiatric:        Mood and Affect: Mood normal.        Behavior: Behavior normal.        Thought Content: Thought content  normal.        Judgment: Judgment normal.      Assessment & Plan:  Immunization due -     Flu vaccine trivalent PF, 6mos and older(Flulaval,Afluria,Fluarix,Fluzone)  Other migraine without status migrainosus, not intractable Assessment & Plan: -Headaches have improved and more manageable. Continue Amitriptyline  25mg  daily at bedtime. Refilled medication.  Orders: -     Amitriptyline  HCl; Take 1 tablet (25 mg total) by mouth at bedtime.  Dispense: 90 tablet; Refill: 1  -Influenza vaccine administered.   Return in about 6 months (around 11/10/2024) for chronic management.   Terry Abila, NP

## 2024-05-12 NOTE — Patient Instructions (Signed)
-  It was a pleasure to seeing you today. -Headaches have improved and more manageable. Continue Amitriptyline  25mg  daily at bedtime. Refilled medication. -Influenza vaccine provided. -Follow up in 6 months for chronic management.

## 2024-05-12 NOTE — Assessment & Plan Note (Signed)
-  Headaches have improved and more manageable. Continue Amitriptyline  25mg  daily at bedtime. Refilled medication.

## 2024-07-29 ENCOUNTER — Encounter: Payer: Self-pay | Admitting: Nurse Practitioner

## 2024-07-29 ENCOUNTER — Ambulatory Visit: Admitting: Nurse Practitioner

## 2024-07-29 VITALS — BP 108/74 | HR 81

## 2024-07-29 DIAGNOSIS — A630 Anogenital (venereal) warts: Secondary | ICD-10-CM

## 2024-07-29 NOTE — Progress Notes (Signed)
" ° °  Acute Office Visit  Subjective:    Patient ID: Molly Walker, female    DOB: 08-27-2000, 24 y.o.   MRN: [id number]   HPI 24 y.o. presents today for vulvar bumps x 4 days. Not painful, unchanged in size. Noticed it while bathing. Last sexual encounter about 2 months ago.   Patient's last menstrual period was 06/29/2024 (exact date). Period Duration (Days): 4 Period Pattern: (!) Irregular Menstrual Flow: Heavy Menstrual Control: Maxi pad Dysmenorrhea: (!) Mild Dysmenorrhea Symptoms: Cramping  Review of Systems  Constitutional: Negative.   Genitourinary:  Positive for genital sores.       Objective:    Physical Exam Exam conducted with a chaperone present.  Constitutional:      Appearance: Normal appearance.  Genitourinary:     BP 108/74   Pulse 81   LMP 06/29/2024 (Exact Date)   SpO2 97%  Wt Readings from Last 3 Encounters:  05/12/24 194 lb (88 kg)  03/23/24 190 lb (86.2 kg)  03/20/24 193 lb (87.5 kg)        Zada Louder, CMA present as biomedical engineer.   Assessment & Plan:   Problem List Items Addressed This Visit   None Visit Diagnoses       Condyloma    -  Primary   Relevant Orders   Cryotherapy, skin lesion      Plan: Educated on genital warts, sexual transmission, HPV nature and options for removal. Would like removed by cryotherapy today. Petroleum jelly applied to surrounding tissue, Verucca freeze applied to lesions, patient tolerated well. Educated on aftercare. Aware cure rate with cryotherapy is 70-80% and may require multiple sessions for complete cure. Has had Gardasil.    Return if symptoms worsen or fail to improve.    Annabella DELENA Shutter DNP, 10:45 AM 07/29/2024 "

## 2024-08-11 ENCOUNTER — Ambulatory Visit: Admitting: Dermatology

## 2024-08-21 ENCOUNTER — Ambulatory Visit: Admitting: Dermatology

## 2024-09-14 ENCOUNTER — Encounter: Payer: Medicaid Other | Admitting: Family Medicine
